# Patient Record
Sex: Female | Born: 1980 | Hispanic: Yes | Marital: Single | Smoking: Never smoker
Health system: Southern US, Community
[De-identification: ages and names within clinical notes are randomized; demographics above are authoritative.]

## PROBLEM LIST (undated history)

## (undated) ENCOUNTER — Inpatient Hospital Stay (HOSPITAL_COMMUNITY): Payer: Medicaid Other

## (undated) DIAGNOSIS — Z789 Other specified health status: Secondary | ICD-10-CM

## (undated) HISTORY — PX: NO PAST SURGERIES: SHX2092

## (undated) HISTORY — DX: Other specified health status: Z78.9

## (undated) HISTORY — PX: OTHER SURGICAL HISTORY: SHX169

---

## 2021-03-04 ENCOUNTER — Other Ambulatory Visit: Payer: Self-pay

## 2021-03-04 ENCOUNTER — Ambulatory Visit (LOCAL_COMMUNITY_HEALTH_CENTER): Payer: Self-pay

## 2021-03-04 VITALS — BP 146/86 | Wt 230.0 lb

## 2021-03-04 DIAGNOSIS — Z3201 Encounter for pregnancy test, result positive: Secondary | ICD-10-CM

## 2021-03-04 LAB — PREGNANCY, URINE: Preg Test, Ur: POSITIVE — AB

## 2021-03-04 MED ORDER — PRENATAL 27-0.8 MG PO TABS: 1.0000 | ORAL_TABLET | Freq: Every day | ORAL | 0 refills | Status: AC

## 2021-03-04 NOTE — Progress Notes (Signed)
UPT positive. Plans prenatal care at [facility]. BP sl elevated today and pt reports no prior hx HBP and states she get very nervous when BP taken. To clerk for preadmit. No NCIR on file. M. Yemen, interpreter. Jerel Shepherd, RN

## 2021-03-15 ENCOUNTER — Telehealth: Payer: Self-pay | Admitting: Family Medicine

## 2021-03-15 NOTE — Telephone Encounter (Signed)
Call to client with Samantha Dominguez. Client reports 5 day history of severe burning stomach pain that is preventing her from eating and drinking. States she feels weak and shaky. As not yet established care in [facility], client counseled to seek medical care today for evaluation. As new to the state, counseled regarding [facility] ED and local walk-in clinics. ESherren Kerns CNM in agreement with above plan. Jossie Ng, RN

## 2021-03-15 NOTE — Telephone Encounter (Signed)
Pt. Wants to be seeing because she is not feeling well, her new OB appointment is on 8/12. If someone can please give her a call back. Thanks (needs interpreter)

## 2021-04-08 DIAGNOSIS — R519 Headache, unspecified: Secondary | ICD-10-CM

## 2021-04-08 DIAGNOSIS — K219 Gastro-esophageal reflux disease without esophagitis: Secondary | ICD-10-CM

## 2021-04-08 HISTORY — DX: Gastro-esophageal reflux disease without esophagitis: K21.9

## 2021-04-08 HISTORY — DX: Headache, unspecified: R51.9

## 2021-04-08 NOTE — Progress Notes (Signed)
OB abstraction completed per phone interview with patient (assisted by language line interpreter Thomasenia Sales id # [phone]) Pt aware of appointment tomorrow. Henriette Combs RN

## 2021-04-09 ENCOUNTER — Ambulatory Visit: Payer: Medicaid Other | Admitting: Advanced Practice Midwife

## 2021-04-09 ENCOUNTER — Encounter: Payer: Self-pay | Admitting: Advanced Practice Midwife

## 2021-04-09 ENCOUNTER — Other Ambulatory Visit: Payer: Self-pay

## 2021-04-09 VITALS — BP 139/78 | HR 97 | Temp 97.1°F | Ht 64.0 in | Wt 230.0 lb

## 2021-04-09 DIAGNOSIS — K117 Disturbances of salivary secretion: Secondary | ICD-10-CM | POA: Insufficient documentation

## 2021-04-09 DIAGNOSIS — O0991 Supervision of high risk pregnancy, unspecified, first trimester: Secondary | ICD-10-CM

## 2021-04-09 DIAGNOSIS — O161 Unspecified maternal hypertension, first trimester: Secondary | ICD-10-CM

## 2021-04-09 DIAGNOSIS — O99211 Obesity complicating pregnancy, first trimester: Secondary | ICD-10-CM

## 2021-04-09 DIAGNOSIS — Z550 Illiteracy and low-level literacy: Secondary | ICD-10-CM

## 2021-04-09 DIAGNOSIS — O10919 Unspecified pre-existing hypertension complicating pregnancy, unspecified trimester: Secondary | ICD-10-CM | POA: Insufficient documentation

## 2021-04-09 DIAGNOSIS — O09529 Supervision of elderly multigravida, unspecified trimester: Secondary | ICD-10-CM

## 2021-04-09 DIAGNOSIS — O0990 Supervision of high risk pregnancy, unspecified, unspecified trimester: Secondary | ICD-10-CM | POA: Insufficient documentation

## 2021-04-09 DIAGNOSIS — O99210 Obesity complicating pregnancy, unspecified trimester: Secondary | ICD-10-CM | POA: Insufficient documentation

## 2021-04-09 DIAGNOSIS — K219 Gastro-esophageal reflux disease without esophagitis: Secondary | ICD-10-CM | POA: Insufficient documentation

## 2021-04-09 LAB — URINALYSIS
Bilirubin, UA: NEGATIVE
Glucose, UA: NEGATIVE
Ketones, UA: NEGATIVE
Leukocytes,UA: NEGATIVE
Nitrite, UA: NEGATIVE
Protein,UA: NEGATIVE
Specific Gravity, UA: 1.01 (ref 1.005–1.030)
Urobilinogen, Ur: 0.2 mg/dL (ref 0.2–1.0)
pH, UA: 6.5 (ref 5.0–7.5)

## 2021-04-09 LAB — HEMOGLOBIN, FINGERSTICK: Hemoglobin: 12.3 g/dL (ref 11.1–15.9)

## 2021-04-09 LAB — WET PREP FOR TRICH, YEAST, CLUE
Trichomonas Exam: NEGATIVE
Yeast Exam: NEGATIVE

## 2021-04-09 NOTE — Progress Notes (Signed)
[facility] HEALTH DEPT [facility] [address] [place] [address] [phone]  INITIAL PRENATAL VISIT NOTE  Subjective:  Samantha Dominguez is a 40 y.o. SHF G4P3003 (18, 9,2) at [redacted]w[redacted]d being seen today to start prenatal care at the [facility].  She feels "good" about surprise pregnancy with no birth control. 40 yo employed FOB feels "fine" about pregnancy; he is the father of her 2 yo also; in supportive 4 year relationship.  She finished first grade and cannot read or write; not working; living with her 2 youngest children (72 yo son married and living in [place]); FOB lives in another residence and pays for her expenses. She denies ER use or u/s this pregnancy.LMP 01/11/21.  Moved to [place] at age 40 and has been back and forth to [place] all her life. Moved to [facility] from [place] in 09/2020. Last dental exam 2 years ago. Wants BTL. She is currently monitored for the following issues for this high-risk pregnancy and has Obesity affecting pregnancy BMI=39.4; Advanced maternal age in multigravida 40 yo; Elevated blood pressure affecting pregnancy in first trimester 04/09/21=139/78 & 146/81; Supervision of high risk pregnancy in first trimester; and GERD (gastroesophageal reflux disease) on their problem list.  Patient reports no complaints.   .  .   . Denies leaking of fluid.   Indications for ASA therapy (per uptodate) One of the following: Previous pregnancy with preeclampsia, especially early onset and with an adverse outcome No Multifetal gestation No Chronic hypertension No Type 1 or 2 diabetes mellitus No Chronic kidney disease No Autoimmune disease (antiphospholipid syndrome, systemic lupus erythematosus) No  Two or more of the following: Nulliparity No Obesity (body mass index >30 kg/m2) Yes Family history of preeclampsia in mother or sister No Age ?35 years Yes Sociodemographic characteristics (African American  race, low socioeconomic level) No Personal risk factors (eg, previous pregnancy with low birth weight or small for gestational age infant, previous adverse pregnancy outcome [eg, stillbirth], interval >10 years between pregnancies) No   The following portions of the patient's history were reviewed and updated as appropriate: allergies, current medications, past family history, past medical history, past social history, past surgical history and problem list. Problem list updated.  Objective:   Vitals:   04/09/21 0917 04/09/21 0924  BP: 139/78   Pulse: 97   Temp: (!) 97.1 F (36.2 C)   Weight: 230 lb (104.3 kg)   Height:  5\' 4"  (1.626 m)    Fetal Status:            Physical Exam Vitals and nursing note reviewed.  Constitutional:      General: She is not in acute distress.    Appearance: Normal appearance. She is well-developed. She is obese.  HENT:     Head: Normocephalic and atraumatic.     Right Ear: External ear normal.     Left Ear: External ear normal.     Nose: Nose normal. No congestion or rhinorrhea.     Mouth/Throat:     Lips: Pink.     Mouth: Mucous membranes are moist.     Dentition: Normal dentition. No dental caries.     Pharynx: Oropharynx is clear. Uvula midline.     Comments: Dentition: last dental exam  2 years ago; encouraged exam asap Eyes:     General: No scleral icterus.    Conjunctiva/sclera: Conjunctivae normal.  Neck:     Thyroid: No thyroid mass or thyroid tenderness.  Cardiovascular:     Rate and Rhythm: Normal rate.     Pulses: Normal pulses.     Comments: Extremities are warm and well perfused Pulmonary:     Effort: Pulmonary effort is normal.     Breath sounds: Normal breath sounds.  Chest:     Chest wall: No mass.  Breasts:    Tanner Score is 5.     Breasts are symmetrical.     Right: Normal. No mass, nipple discharge or skin change.     Left: Normal. No mass, nipple discharge or skin change.  Abdominal:     Palpations: Abdomen is  soft.     Tenderness: There is no abdominal tenderness.     Comments: Gravid, poor tone, soft without masses or tenderness, no FHR heard, unable to assess uterine size due to increased adipose  Genitourinary:    General: Normal vulva.     Exam position: Lithotomy position.     Pubic Area: No rash.      Labia:        Right: No rash.        Left: No rash.      Vagina: Vaginal discharge (white creamy leukorrhea, ph<4.5) present.     Cervix: Normal.     Uterus: Enlarged (Gravid ? size; unable to assess due to increased adippose). Not tender.      Rectum: Normal. No external hemorrhoid.     Comments: Pap done Musculoskeletal:     Right lower leg: No edema.     Left lower leg: No edema.  Lymphadenopathy:     Cervical: No cervical adenopathy.     Upper Body:     Right upper body: No axillary adenopathy.     Left upper body: No axillary adenopathy.  Skin:    General: Skin is warm.     Capillary Refill: Capillary refill takes less than 2 seconds.  Neurological:     Mental Status: She is alert.    Assessment and Plan:  Pregnancy: G4P3003 at [redacted]w[redacted]d  1. Obesity affecting pregnancy, antepartum Accepts ASA 81 mg daily and agrees to buy today - Glucose, 1 hour gestational - Comprehensive metabolic panel - Protein / creatinine ratio, urine  (Spot) - TSH  2. Antepartum multigravida of advanced maternal age  25. Elevated blood pressure affecting pregnancy in first trimester 04/09/21= 139/78 146/81 Pt denies any blood pressure issues with any of her prior 3 pregnancies.  PIH panel drawn Monitor BP closely  4. Supervision of high risk pregnancy in first trimester Desires FIRST screen--ordered Early glucola done today Counseled on wt gain of 11-20 lbs this pregnancy Please give dental list to pt Pt desires BTL; please give pt list of cost for BTL with [facility] - HIV [place] [facility] - Prenatal Profile with Varicella(282020) - Varicella zoster antibody, IgG - HCV Ab w Reflex to Quant PCR -  Urine Culture - Chlamydia/GC NAA, Confirmation - Hemoglobinopathy evaluation -967893 - QuantiFERON-TB Gold Plus - Hemoglobin, fingerstick - Urinalysis (Urine Dip) - 810175 Drug Screen - TSH + free T4 - Hgb A1c w/o eAG - WET PREP FOR TRICH, YEAST, CLUE - IGP, Aptima HPV - PIH Panel ([facility] [id number])    Discussed overview of care and coordination with inpatient delivery practices including [facility], [facility], [facility] and [facility].   Reviewed Centering pregnancy as standard of care at [facility]   Preterm labor symptoms and general obstetric precautions including but not limited to vaginal bleeding, contractions, leaking of fluid and fetal movement  were reviewed in detail with the patient.  Please refer to After Visit Summary for other counseling recommendations.   Return in about 4 weeks (around 05/07/2021) for routine PNC.  No future appointments.  Alberteen Spindle, CNM

## 2021-04-09 NOTE — Progress Notes (Signed)
Urine dip, wet prep, hgb reviewed. WNL. NO treatment indicated.   Lead questionnaire = negative.   [place] referral received today from provider. Faxed to [facility] with confirmation on 04/09/21. Patient aware she will receive a call to schedule upcoming [place].   Floy Sabina, RN

## 2021-04-10 LAB — PROTEIN / CREATININE RATIO, URINE
Creatinine, Urine: 40 mg/dL
Protein, Ur: 9.6 mg/dL
Protein/Creat Ratio: 240 mg/g creat — ABNORMAL HIGH (ref 0–200)

## 2021-04-10 LAB — 789231 7+OXYCODONE-BUND
Amphetamines, Urine: NEGATIVE ng/mL
BENZODIAZ UR QL: NEGATIVE ng/mL
Barbiturate screen, urine: NEGATIVE ng/mL
Cannabinoid Quant, Ur: NEGATIVE ng/mL
Cocaine (Metab.): NEGATIVE ng/mL
OPIATE SCREEN URINE: NEGATIVE ng/mL
Oxycodone/Oxymorphone, Urine: NEGATIVE ng/mL
PCP Quant, Ur: NEGATIVE ng/mL

## 2021-04-11 LAB — URINE CULTURE

## 2021-04-13 LAB — CHLAMYDIA/GC NAA, CONFIRMATION
Chlamydia trachomatis, NAA: NEGATIVE
Neisseria gonorrhoeae, NAA: NEGATIVE

## 2021-04-14 ENCOUNTER — Telehealth: Payer: Self-pay

## 2021-04-14 NOTE — Telephone Encounter (Signed)
Call received from Clydie Braun MFM scheduler, in response to faxed referral for first screen with no genetic counseling. Per Britta Mccreedy, no available appt in needed time frame for first screen. Call to MFM in [place] and per [place], they do not have any appts in required time frame. As client does not desire genetic counseling, referral returned to E. Sciora CNM with above noted on referral. Jossie Ng, RN

## 2021-04-15 ENCOUNTER — Telehealth: Payer: Self-pay

## 2021-04-15 ENCOUNTER — Telehealth: Payer: Self-pay | Admitting: Obstetrics and Gynecology

## 2021-04-15 LAB — IGP, APTIMA HPV
HPV Aptima: NEGATIVE
PAP Smear Comment: 0

## 2021-04-15 NOTE — Telephone Encounter (Signed)
PC to patient with assistance of [employer]pre[employer] to clarify what appointments the patient desires. She stated that she is not interested in screening for chromosome conditions unless it is absolutely necessary.  I explained that it was her choice if she wanted that information. She stated that she has faith that the baby will be healthy and does not desire any testing other than ultrasound.  Because no fetal heart tones were heard at her last OB visit, we scheduled her for a first trimester viability u/s in [facility] tomorrow 8/19 at 10:30.  She will also be scheduled for an ultrasound in [facility] at [facility] in the second trimester.    Cherly Anderson, MS, CGC

## 2021-04-15 NOTE — Telephone Encounter (Signed)
Contacted patient regarding upcoming ultrasound appointments:  [place] viability u/s scheduled 04/16/21 at 10:30 [place] [facility] for anatomy 18-19wks on 9/27 at 0945  Patient aware and all questions answered.   Floy Sabina, RN

## 2021-04-15 NOTE — Addendum Note (Signed)
Addended by: Floy Sabina on: 04/15/2021 03:14 PM   Modules accepted: Orders

## 2021-04-16 ENCOUNTER — Encounter: Payer: Self-pay | Admitting: *Deleted

## 2021-04-16 ENCOUNTER — Other Ambulatory Visit: Payer: Self-pay

## 2021-04-16 ENCOUNTER — Ambulatory Visit: Payer: Medicaid Other | Admitting: *Deleted

## 2021-04-16 ENCOUNTER — Telehealth: Payer: Self-pay

## 2021-04-16 ENCOUNTER — Ambulatory Visit: Payer: Medicaid Other | Attending: Advanced Practice Midwife

## 2021-04-16 VITALS — BP 135/79 | HR 79

## 2021-04-16 DIAGNOSIS — O0991 Supervision of high risk pregnancy, unspecified, first trimester: Secondary | ICD-10-CM | POA: Insufficient documentation

## 2021-04-16 DIAGNOSIS — O09521 Supervision of elderly multigravida, first trimester: Secondary | ICD-10-CM

## 2021-04-16 DIAGNOSIS — Z6791 Unspecified blood type, Rh negative: Secondary | ICD-10-CM | POA: Insufficient documentation

## 2021-04-16 DIAGNOSIS — Z2839 Other underimmunization status: Secondary | ICD-10-CM | POA: Insufficient documentation

## 2021-04-16 DIAGNOSIS — O26899 Other specified pregnancy related conditions, unspecified trimester: Secondary | ICD-10-CM | POA: Insufficient documentation

## 2021-04-16 DIAGNOSIS — O09899 Supervision of other high risk pregnancies, unspecified trimester: Secondary | ICD-10-CM | POA: Insufficient documentation

## 2021-04-16 DIAGNOSIS — O99810 Abnormal glucose complicating pregnancy: Secondary | ICD-10-CM | POA: Insufficient documentation

## 2021-04-16 LAB — COMPREHENSIVE METABOLIC PANEL
ALT: 23 IU/L (ref 0–32)
Albumin/Globulin Ratio: 1.5 (ref 1.2–2.2)
Albumin: 4.4 g/dL (ref 3.8–4.8)
Alkaline Phosphatase: 89 IU/L (ref 44–121)
BUN/Creatinine Ratio: 7 — ABNORMAL LOW (ref 9–23)
Bilirubin Total: 0.4 mg/dL (ref 0.0–1.2)
CO2: 22 mmol/L (ref 20–29)
Calcium: 9.3 mg/dL (ref 8.7–10.2)
Chloride: 99 mmol/L (ref 96–106)
Globulin, Total: 3 g/dL (ref 1.5–4.5)
Glucose: 154 mg/dL — ABNORMAL HIGH (ref 65–99)
Potassium: 3.6 mmol/L (ref 3.5–5.2)
Sodium: 136 mmol/L (ref 134–144)
Total Protein: 7.4 g/dL (ref 6.0–8.5)

## 2021-04-16 LAB — CBC/D/PLT+RPR+RH+ABO+RUB AB...
Antibody Screen: NEGATIVE
Basophils Absolute: 0 10*3/uL (ref 0.0–0.2)
Basos: 1 %
EOS (ABSOLUTE): 0 10*3/uL (ref 0.0–0.4)
Eos: 0 %
Hematocrit: 38.3 % (ref 34.0–46.6)
Hemoglobin: 12.2 g/dL (ref 11.1–15.9)
Hepatitis B Surface Ag: NEGATIVE
Immature Grans (Abs): 0 10*3/uL (ref 0.0–0.1)
Immature Granulocytes: 0 %
Lymphocytes Absolute: 1.3 10*3/uL (ref 0.7–3.1)
Lymphs: 18 %
MCH: 24.4 pg — ABNORMAL LOW (ref 26.6–33.0)
MCHC: 31.9 g/dL (ref 31.5–35.7)
MCV: 77 fL — ABNORMAL LOW (ref 79–97)
Monocytes Absolute: 0.3 10*3/uL (ref 0.1–0.9)
Monocytes: 4 %
Neutrophils Absolute: 5.8 10*3/uL (ref 1.4–7.0)
Neutrophils: 77 %
Platelets: 319 10*3/uL (ref 150–450)
RBC: 5 x10E6/uL (ref 3.77–5.28)
RDW: 17.9 % — ABNORMAL HIGH (ref 11.7–15.4)
RPR Ser Ql: NONREACTIVE
Rh Factor: NEGATIVE
Rubella Antibodies, IGG: 19.5 index (ref >0.99)
Varicella zoster IgG: <135 index — ABNORMAL LOW (ref >165)
WBC: 7.5 10*3/uL (ref 3.4–10.8)

## 2021-04-16 LAB — HCV AB W REFLEX TO QUANT PCR: HCV Ab: 0.1 s/co ratio (ref 0.0–0.9)

## 2021-04-16 LAB — HGB FRACTIONATION CASCADE
Hgb A2: 2.4 % (ref 1.8–3.2)
Hgb A: 97.6 % (ref 96.4–98.8)
Hgb F: 0 % (ref 0.0–2.0)
Hgb S: 0 %

## 2021-04-16 LAB — QUANTIFERON-TB GOLD PLUS
QuantiFERON Mitogen Value: 6.1 IU/mL
QuantiFERON Nil Value: 0.08 IU/mL
QuantiFERON TB1 Ag Value: 0.05 IU/mL
QuantiFERON TB2 Ag Value: 0.11 IU/mL
QuantiFERON-TB Gold Plus: NEGATIVE

## 2021-04-16 LAB — AST+BUN+CREAT+LD+URIC A+HGB...
AST: 15 IU/L (ref 0–40)
BUN: 4 mg/dL — ABNORMAL LOW (ref 6–20)
Creatinine, Ser: 0.57 mg/dL (ref 0.57–1.00)
LDH: 141 IU/L (ref 119–226)
Uric Acid: 3.9 mg/dL (ref 2.6–6.2)
eGFR: 118 mL/min/{1.73_m2} (ref >59)

## 2021-04-16 LAB — HIV-1/HIV-2 QUALITATIVE RNA
HIV-1 RNA, Qualitative: NONREACTIVE
HIV-2 RNA, Qualitative: NONREACTIVE

## 2021-04-16 LAB — TSH+FREE T4
Free T4: 1.27 ng/dL (ref 0.82–1.77)
TSH: 2.55 u[IU]/mL (ref 0.450–4.500)

## 2021-04-16 LAB — HGB A1C W/O EAG: Hgb A1c MFr Bld: 5.4 % (ref 4.8–5.6)

## 2021-04-16 LAB — GLUCOSE, 1 HOUR GESTATIONAL: Gestational Diabetes Screen: 144 mg/dL — ABNORMAL HIGH (ref 65–139)

## 2021-04-16 LAB — HCV INTERPRETATION

## 2021-04-16 NOTE — Telephone Encounter (Signed)
Attempt to contact patient regarding elevated 1 hr glucose. Want to scheduled visit for nurse clinic/lab for 3 hr gtt as soon as possible.   No answer and LVM for patient to return call at [phone].   Floy Sabina, RN

## 2021-04-16 NOTE — Telephone Encounter (Signed)
Patient returned phone call and made an appointment for 3 hr gtt on 04/20/21. Patient advised to report to lab after check-in and to come in fasting for 12 hours prior to appointment. Patient reports understanding and all questions answered.   Floy Sabina, RN

## 2021-04-16 NOTE — Progress Notes (Signed)
BP rechecked per Dr. Tedra Senegal

## 2021-04-19 NOTE — Addendum Note (Signed)
Addended by: Heywood Bene on: 04/19/2021 12:25 PM   Modules accepted: Orders

## 2021-04-20 ENCOUNTER — Other Ambulatory Visit: Payer: Medicaid Other

## 2021-04-20 ENCOUNTER — Other Ambulatory Visit: Payer: Self-pay

## 2021-04-20 ENCOUNTER — Encounter: Payer: Self-pay | Admitting: Advanced Practice Midwife

## 2021-04-20 DIAGNOSIS — Z719 Counseling, unspecified: Secondary | ICD-10-CM

## 2021-04-20 NOTE — Progress Notes (Signed)
Received call from [facility] lab that client ate a chicken sandwich last night at 2300. Client to [facility] and counseled regarding why test unable to be performed today, importance of 3 hour GTT and test prep instructions. Client verbalized understanding. Due to transportation issues, client requested appt be scheduled on 04/26/2021. Appt scheduled and client to come to clinic after drops daughter off at school on Monday. Appt reminder card given. Roddie Mc Yemen interpreted during above conversation. Jossie Ng, RN

## 2021-04-23 ENCOUNTER — Telehealth: Payer: Self-pay

## 2021-04-23 NOTE — Telephone Encounter (Signed)
Patient called she need to reschedule her 3 hr gtt for Monday 9/29 as she has transportation issues. Patient can only come in Friday morning so I scheduled patient to come in Friday September 2 at 0800. Emphasized to patient not food in the mouth 12 hours before appointment, so I told her so eating after 8pm the night before or no drinks as well. Patient verbalized understanding.   Floy Sabina, RN

## 2021-04-26 ENCOUNTER — Other Ambulatory Visit: Payer: Medicaid Other

## 2021-04-29 ENCOUNTER — Encounter: Payer: Self-pay | Admitting: Advanced Practice Midwife

## 2021-04-30 ENCOUNTER — Other Ambulatory Visit: Payer: Medicaid Other

## 2021-04-30 ENCOUNTER — Other Ambulatory Visit: Payer: Self-pay

## 2021-04-30 VITALS — Wt 228.3 lb

## 2021-04-30 DIAGNOSIS — O99810 Abnormal glucose complicating pregnancy: Secondary | ICD-10-CM

## 2021-04-30 NOTE — Progress Notes (Signed)
Patient seen in Nurse Clinic for a 3 HR GTT lab test.  She verbalizes being NPO since last night at 7 pm.  She verbalizes understanding to remain NPO, no chewing gum and not to be moving around burning calories until her last lab draw at 11:30 am.  Advised to notify staff if she were to start with nausea and/or vomiting. Hart Carwin, RN

## 2021-05-01 LAB — GLUCOSE TOLERANCE TEST, 6 HOUR
Glucose, 1 Hour GTT: 179 mg/dL (ref 65–199)
Glucose, 2 hour: 155 mg/dL — ABNORMAL HIGH (ref 65–139)
Glucose, 3 hour: 94 mg/dL (ref 65–109)
Glucose, GTT - Fasting: 94 mg/dL (ref 65–99)

## 2021-05-04 ENCOUNTER — Other Ambulatory Visit: Payer: Self-pay | Admitting: Family Medicine

## 2021-05-04 DIAGNOSIS — O99810 Abnormal glucose complicating pregnancy: Secondary | ICD-10-CM

## 2021-05-04 NOTE — Progress Notes (Signed)
Abnormal 3 hour gtt with 2hr = 155

## 2021-05-07 ENCOUNTER — Other Ambulatory Visit: Payer: Self-pay

## 2021-05-07 ENCOUNTER — Encounter: Payer: Self-pay | Admitting: Dietician

## 2021-05-07 ENCOUNTER — Ambulatory Visit: Payer: Medicaid Other | Admitting: Advanced Practice Midwife

## 2021-05-07 ENCOUNTER — Ambulatory Visit: Payer: Medicaid Other | Admitting: Dietician

## 2021-05-07 VITALS — BP 147/84 | HR 102 | Temp 97.5°F | Wt 228.8 lb

## 2021-05-07 DIAGNOSIS — O99810 Abnormal glucose complicating pregnancy: Secondary | ICD-10-CM

## 2021-05-07 DIAGNOSIS — O99212 Obesity complicating pregnancy, second trimester: Secondary | ICD-10-CM

## 2021-05-07 DIAGNOSIS — O0992 Supervision of high risk pregnancy, unspecified, second trimester: Secondary | ICD-10-CM

## 2021-05-07 DIAGNOSIS — O161 Unspecified maternal hypertension, first trimester: Secondary | ICD-10-CM

## 2021-05-07 DIAGNOSIS — O99210 Obesity complicating pregnancy, unspecified trimester: Secondary | ICD-10-CM

## 2021-05-07 DIAGNOSIS — O26892 Other specified pregnancy related conditions, second trimester: Secondary | ICD-10-CM

## 2021-05-07 DIAGNOSIS — Z6791 Unspecified blood type, Rh negative: Secondary | ICD-10-CM

## 2021-05-07 DIAGNOSIS — O09899 Supervision of other high risk pregnancies, unspecified trimester: Secondary | ICD-10-CM

## 2021-05-07 DIAGNOSIS — O0991 Supervision of high risk pregnancy, unspecified, first trimester: Secondary | ICD-10-CM

## 2021-05-07 DIAGNOSIS — Z2839 Other underimmunization status: Secondary | ICD-10-CM

## 2021-05-07 DIAGNOSIS — O09522 Supervision of elderly multigravida, second trimester: Secondary | ICD-10-CM

## 2021-05-07 DIAGNOSIS — O26891 Other specified pregnancy related conditions, first trimester: Secondary | ICD-10-CM

## 2021-05-07 DIAGNOSIS — O09529 Supervision of elderly multigravida, unspecified trimester: Secondary | ICD-10-CM

## 2021-05-07 NOTE — Progress Notes (Signed)
Assessment: Ht: 65 in Wt: 228 lbs BMI:  Labs: na Meds: PNV Dx: Abnormal glucose in pregnancy Living Conditions:  Adequate household items to prepare/store food Language:  Spanish Time:   1:00 PM   Duration:  20 minutes   Spoke to client today regarding abnormal glucose in pregnancy. She had previously had severe nausea/vomiting which she reports is improving. She provided a 24 hour recall. I explained the abnormal glucose and provided recommendations. She was compliant and receptive.   24 Hour Recall/Diet Hx Breakfast:  2 eggs with toast and juice Lunch:  soup with carrots, potatoes and pasta with water Dinner:  soup again with juice Snack:  oatmeal with water  Diagnosis: Abnormal glucose related to pregnancy as evidenced by a glucose level of 155. Intervention:  1) 30-45 g of C for meals, 15 g for snacks 2) 5-6 small meals per day 3) protein with meals 4) water instead of juice 5) exercise daily with provider's permission  Monitoring/Evaluation:  Will follow up at [facility] appointments  Marcha Dutton RDN, LDN 05/07/21

## 2021-05-07 NOTE — Progress Notes (Signed)
PRENATAL VISIT NOTE  Subjective:  Samantha Dominguez is a 40 y.o. G4P3003 at [redacted]w[redacted]d being seen today for ongoing prenatal care.  She is currently monitored for the following issues for this high-risk pregnancy and has Obesity affecting pregnancy BMI=39.4; Advanced maternal age in multigravida 40 yo; Elevated blood pressure affecting pregnancy in first trimester 04/09/21=139/78 & 146/81; Supervision of high risk pregnancy in first trimester; GERD (gastroesophageal reflux disease); Low-level of literacy (completed first grade); Ptyalism; Rh negative status during pregnancy; Maternal varicella, non-immune; and Abnormal glucose affecting pregnancy 04/09/21: 3 hour GTT=equivocal on their problem list.  Patient reports vomiting.  Contractions: Not present. Vag. Bleeding: None.  Movement: Absent. Denies leaking of fluid/ROM.   The following portions of the patient's history were reviewed and updated as appropriate: allergies, current medications, past family history, past medical history, past social history, past surgical history and problem list. Problem list updated.  Objective:   Vitals:   05/07/21 1559  BP: (!) 147/84  Pulse: (!) 102  Temp: (!) 97.5 F (36.4 C)  Weight: 228 lb 12.8 oz (103.8 kg)    Fetal Status: Fetal Heart Rate (bpm): 160 Fundal Height: 15 cm Movement: Absent     General:  Alert, oriented and cooperative. Patient is in no acute distress.  Skin: Skin is warm and dry. No rash noted.   Cardiovascular: Normal heart rate noted  Respiratory: Normal respiratory effort, no problems with respiration noted  Abdomen: Soft, gravid, appropriate for gestational age.  Pain/Pressure: Absent     Pelvic: Cervical exam deferred        Extremities: Normal range of motion.  Edema: None  Mental Status: Normal mood and affect. Normal behavior. Normal judgment and thought content.   Assessment and Plan:  Pregnancy: G4P3003 at [redacted]w[redacted]d  1. Rh negative status during pregnancy in first  trimester 2. Maternal varicella, non-immune  3. Abnormal glucose affecting pregnancy Equivocal 3 hour GTT on 04/30/21 with ADA diet counseling done 05/07/21 Needs 3 hour GTT at 28 wks  4. Supervision of high risk pregnancy in first trimester C/o N&V 2-3x/day but states is getting better. Yesterday vomited 3x. Today vomited 3x. Day before vomited 2x. Eating protein foods--suggestions given. Accepts Phenergan rx Declined quad screen and genetic testing 1 hour glucola on 04/09/21=144 Not working; here with 2 daughters - Urinalysis (Urine Dip)  5. Antepartum multigravida of advanced maternal age Pt reminded of anatomy u/s 05/25/21 with MFM Will need serial growth u/s q 4 wks due to 40 yo at birth  59. Obesity affecting pregnancy, antepartum 38 lb 12.8 oz (17.6 kg) Lost 2 lbs in last 3 wks Taking ASA 81 mg daily  7. Elevated blood pressure affecting pregnancy in first trimester 04/09/21=139/78 & 146/81 BP continues to be elevated: 04/09/21=146/78, 139/78                                                 04/16/21=164/86, 135/79                                                 05/07/21=147/84, 145/86 Denies h/a, scotoma, epigastric pain. Denies any HTN issues in past or with pregnancies. Lab refuses to check urine or do bloodwork today because it is  5:01 pm.  RTC 05/10/21 for urine dip, spot protein/creat ratio, TSH Spot protein/creat ratio on 04/09/21=240 TSH on 04/09/21=2.5 wnl Suspect chronic HTN   Preterm labor symptoms and general obstetric precautions including but not limited to vaginal bleeding, contractions, leaking of fluid and fetal movement were reviewed in detail with the patient. Please refer to After Visit Summary for other counseling recommendations.  No follow-ups on file.  Future Appointments  Date Time Provider Department Center  05/25/2021 10:00 AM [facility]    Alberteen Spindle, CNM

## 2021-05-07 NOTE — Progress Notes (Addendum)
Patient here for MH RV at 15 2/7. Quad declination signed. Aware of U/S on 05/25/2021 at 0945, and aware she may not bring children with her. Patient talked with nutritionist and states she is so nauseas she can't eat much, states she has bitter taste in her mouth and vomits 3x/day. States she can eat eggs and fruit, but no meat..Counseled Rh negative status and varicella non-immune status, literature given. Counseling done by E. Leticia Clas, RN.  ..Burt Knack, RN

## 2021-05-10 ENCOUNTER — Ambulatory Visit: Payer: Medicaid Other | Admitting: Advanced Practice Midwife

## 2021-05-10 ENCOUNTER — Other Ambulatory Visit: Payer: Self-pay

## 2021-05-10 VITALS — BP 138/81 | HR 98 | Temp 97.5°F | Wt 227.8 lb

## 2021-05-10 DIAGNOSIS — O09522 Supervision of elderly multigravida, second trimester: Secondary | ICD-10-CM

## 2021-05-10 DIAGNOSIS — O99210 Obesity complicating pregnancy, unspecified trimester: Secondary | ICD-10-CM

## 2021-05-10 DIAGNOSIS — O99212 Obesity complicating pregnancy, second trimester: Secondary | ICD-10-CM

## 2021-05-10 DIAGNOSIS — O09529 Supervision of elderly multigravida, unspecified trimester: Secondary | ICD-10-CM

## 2021-05-10 DIAGNOSIS — O162 Unspecified maternal hypertension, second trimester: Secondary | ICD-10-CM

## 2021-05-10 DIAGNOSIS — O0992 Supervision of high risk pregnancy, unspecified, second trimester: Secondary | ICD-10-CM

## 2021-05-10 DIAGNOSIS — O0991 Supervision of high risk pregnancy, unspecified, first trimester: Secondary | ICD-10-CM

## 2021-05-10 DIAGNOSIS — O161 Unspecified maternal hypertension, first trimester: Secondary | ICD-10-CM

## 2021-05-10 LAB — URINALYSIS
Bilirubin, UA: NEGATIVE
Glucose, UA: NEGATIVE
Nitrite, UA: NEGATIVE
Protein,UA: NEGATIVE
Specific Gravity, UA: 1.015 (ref 1.005–1.030)
Urobilinogen, Ur: 0.2 mg/dL (ref 0.2–1.0)
pH, UA: 6 (ref 5.0–7.5)

## 2021-05-10 NOTE — Addendum Note (Signed)
Addended by: Heywood Bene on: 05/10/2021 02:18 PM   Modules accepted: Orders

## 2021-05-10 NOTE — Progress Notes (Signed)
Patient here for OB problem visit, for BP check and urine dip.Burt Knack, RN

## 2021-05-10 NOTE — Progress Notes (Signed)
   PRENATAL VISIT NOTE  Subjective:  Samantha Dominguez is a 40 y.o. G4P3003 at [redacted]w[redacted]d being seen today for ongoing prenatal care.  She is currently monitored for the following issues for this high-risk pregnancy and has Obesity affecting pregnancy BMI=39.4; Advanced maternal age in multigravida 40 yo; Elevated blood pressure affecting pregnancy in first trimester 04/09/21=139/78 & 146/81; Supervision of high risk pregnancy in first trimester; GERD (gastroesophageal reflux disease); Low-level of literacy (completed first grade); Ptyalism; Rh negative status during pregnancy; Maternal varicella, non-immune; and Abnormal glucose affecting pregnancy 04/09/21: 3 hour GTT=equivocal on their problem list.  Patient reports vomiting.  Contractions: Not present. Vag. Bleeding: None.  Movement: Absent. Denies leaking of fluid/ROM.   The following portions of the patient's history were reviewed and updated as appropriate: allergies, current medications, past family history, past medical history, past social history, past surgical history and problem list. Problem list updated.  Objective:   Vitals:   05/10/21 0830  BP: 138/81  Pulse: 98  Temp: (!) 97.5 F (36.4 C)  Weight: 227 lb 12.8 oz (103.3 kg)    Fetal Status: Fetal Heart Rate (bpm): 160 Fundal Height: 15 cm Movement: Absent     General:  Alert, oriented and cooperative. Patient is in no acute distress.  Skin: Skin is warm and dry. No rash noted.   Cardiovascular: Normal heart rate noted  Respiratory: Normal respiratory effort, no problems with respiration noted  Abdomen: Soft, gravid, appropriate for gestational age.  Pain/Pressure: Absent     Pelvic: Cervical exam deferred        Extremities: Normal range of motion.  Edema: None  Mental Status: Normal mood and affect. Normal behavior. Normal judgment and thought content.   Assessment and Plan:  Pregnancy: G4P3003 at [redacted]w[redacted]d  1. Supervision of high risk pregnancy in first trimester States  she has taken Phenergan only once since given on 05/07/21 (took at 9 pm 05/07/21) Continues to vomit q am  Vomited at 0600 this am, yesterday x1 in am only, day before x1 in am only Breakfast this am: 2 bananas, 1 glass of milk Dinner last night: rice, potatoes, brocolli, water Snack before bed: oatmeal TSH today Reviewed 04/16/21 u/s at 12 2/7 wks with EDC change of 10/27/21; reminded of anatomy u/s 05/25/21 Wants Nexplanon pp   - Urinalysis - TSH - Protein / creatinine ratio, urine  (Spot) - Urine Culture & Sensitivity  2. Obesity affecting pregnancy, antepartum 37 lb 12.8 oz (17.1 kg) 1 lb wt loss since 05/07/21 (227 lbs today) Taking ASA 81 mg daily  3. Elevated blood pressure affecting pregnancy in first trimester 04/09/21=139/78 & 146/81 138/81 today 05/07/21=147/84, 145/86 04/16/21=164/86, 135/79 04/09/21=146/78, 139/78 Neg proteinuria today, 1+ blood, tr ketones; last sex 2 mo ago; C&S today Denies h/a, scotoma, epigastric pain Denies any HTN issues in past or with pregnancies Spot protein/creat ratio on 04/09/21=240 Suspect chronic HTN  4. Antepartum multigravida of advanced maternal age Will need serial growth u/s q 4 wks due to age and BMI   Preterm labor symptoms and general obstetric precautions including but not limited to vaginal bleeding, contractions, leaking of fluid and fetal movement were reviewed in detail with the patient. Please refer to After Visit Summary for other counseling recommendations.  No follow-ups on file.  Future Appointments  Date Time Provider Department Center  05/25/2021 10:00 AM [facility]  05/28/2021  4:00 PM [facility] PROVIDER [facility] None    Alberteen Spindle, CNM

## 2021-05-10 NOTE — Progress Notes (Signed)
Urine dip reviewed with provider. Urine spot prot/creat and urine culture added to orders.Burt Knack, RN

## 2021-05-11 LAB — TSH: TSH: 3.81 u[IU]/mL (ref 0.450–4.500)

## 2021-05-11 LAB — PROTEIN / CREATININE RATIO, URINE
Creatinine, Urine: 139.9 mg/dL
Protein, Ur: 18.9 mg/dL
Protein/Creat Ratio: 135 mg/g creat (ref 0–200)

## 2021-05-14 LAB — URINE CULTURE

## 2021-05-18 NOTE — Addendum Note (Signed)
Addended by: Burt Knack on: 05/18/2021 11:13 AM   Modules accepted: Orders

## 2021-05-20 ENCOUNTER — Other Ambulatory Visit: Payer: Self-pay | Admitting: Physician Assistant

## 2021-05-20 DIAGNOSIS — O99212 Obesity complicating pregnancy, second trimester: Secondary | ICD-10-CM

## 2021-05-20 DIAGNOSIS — O09523 Supervision of elderly multigravida, third trimester: Secondary | ICD-10-CM

## 2021-05-25 ENCOUNTER — Ambulatory Visit (HOSPITAL_BASED_OUTPATIENT_CLINIC_OR_DEPARTMENT_OTHER): Payer: Medicaid Other | Admitting: Maternal & Fetal Medicine

## 2021-05-25 ENCOUNTER — Other Ambulatory Visit: Payer: Self-pay

## 2021-05-25 ENCOUNTER — Ambulatory Visit: Payer: Medicaid Other | Attending: Maternal & Fetal Medicine

## 2021-05-25 VITALS — BP 147/82 | HR 106 | Temp 98.5°F | Ht 66.0 in | Wt 229.0 lb

## 2021-05-25 DIAGNOSIS — Z3689 Encounter for other specified antenatal screening: Secondary | ICD-10-CM

## 2021-05-25 DIAGNOSIS — O09522 Supervision of elderly multigravida, second trimester: Secondary | ICD-10-CM | POA: Diagnosis not present

## 2021-05-25 DIAGNOSIS — Z3A17 17 weeks gestation of pregnancy: Secondary | ICD-10-CM

## 2021-05-25 DIAGNOSIS — O10912 Unspecified pre-existing hypertension complicating pregnancy, second trimester: Secondary | ICD-10-CM

## 2021-05-25 DIAGNOSIS — O99212 Obesity complicating pregnancy, second trimester: Secondary | ICD-10-CM | POA: Diagnosis not present

## 2021-05-25 DIAGNOSIS — O09523 Supervision of elderly multigravida, third trimester: Secondary | ICD-10-CM

## 2021-05-25 DIAGNOSIS — O321XX0 Maternal care for breech presentation, not applicable or unspecified: Secondary | ICD-10-CM | POA: Insufficient documentation

## 2021-05-25 NOTE — Progress Notes (Signed)
MFM Brief Note  Single intrauterine pregnancy here for a detailed anatomy due to chronic hypertension on low dose ASA and elevated BMI 44 kg/m3. Normal anatomy with measurements consistent with dates There is good fetal movement and amniotic fluid volume Suboptimal views of the fetal anatomy were obtained secondary to fetal position  Her blood pressure today was 157/96 and 147/82 mmHg.  Ms. Muratalla will turn 40 yo at the time of delivery. We discussed the increased risk of FGR, preeclampsia, gestational diabetes and stillbirth. Therefore we recommend serial growth exams every 4 weeks, with inititiation of weekly testing at 36 weeks.   Given her chronic hypertension she has normal PIH labs. Her blood pressure has occasionally been elevated at her provider visits and again today. We recommend initiating antihypertensive therapy if her blood pressure persist 150/105 >mmHg.   Regarding her elevated BMI we recommend 11-20lb weight gain. We discussed similar risk as associated with  gestational diabetes, fetal macrosomia, preeclampsia, but also thrombosis and risk for cesarean delivery.  Recommendations Follow up growth and detailed anatomy in 4 weeks.  Initiate weekly testing at 36 weeks. Serial growth exams every 4 weeks.   I spent 30 minutes > 50% in face to face consultation.  Novella Olive, MD.

## 2021-05-26 ENCOUNTER — Encounter: Payer: Self-pay | Admitting: Family Medicine

## 2021-05-26 ENCOUNTER — Encounter: Payer: Self-pay | Admitting: Advanced Practice Midwife

## 2021-05-28 ENCOUNTER — Ambulatory Visit: Payer: Medicaid Other

## 2021-06-03 NOTE — Progress Notes (Signed)
Patient transfer of care referral faxed with records and U/S x 2, to [facility] on 06/03/2021 at 6pm. Per E. Sciora, referral was to be held until patient was counseled about need for transfer of care due to chronic HTN in pregnancy at her 05/28/2021 appointment. Patient canceled that appointment and is now coming in on 06/08/2021. Per Hazle Coca, referral to be faxed and patient called on 06/04/2021 to explain TOC to patient. Burt Knack, RN

## 2021-06-08 ENCOUNTER — Other Ambulatory Visit: Payer: Self-pay

## 2021-06-08 ENCOUNTER — Ambulatory Visit: Payer: Medicaid Other | Admitting: Family Medicine

## 2021-06-08 VITALS — BP 145/88 | HR 93 | Temp 98.5°F | Wt 226.2 lb

## 2021-06-08 DIAGNOSIS — Z23 Encounter for immunization: Secondary | ICD-10-CM

## 2021-06-08 DIAGNOSIS — O0992 Supervision of high risk pregnancy, unspecified, second trimester: Secondary | ICD-10-CM

## 2021-06-08 DIAGNOSIS — O99210 Obesity complicating pregnancy, unspecified trimester: Secondary | ICD-10-CM

## 2021-06-08 DIAGNOSIS — O162 Unspecified maternal hypertension, second trimester: Secondary | ICD-10-CM

## 2021-06-08 DIAGNOSIS — O0991 Supervision of high risk pregnancy, unspecified, first trimester: Secondary | ICD-10-CM

## 2021-06-08 DIAGNOSIS — O161 Unspecified maternal hypertension, first trimester: Secondary | ICD-10-CM

## 2021-06-08 DIAGNOSIS — O99212 Obesity complicating pregnancy, second trimester: Secondary | ICD-10-CM

## 2021-06-08 LAB — URINALYSIS
Bilirubin, UA: NEGATIVE
Glucose, UA: NEGATIVE
Ketones, UA: NEGATIVE
Leukocytes,UA: NEGATIVE
Nitrite, UA: NEGATIVE
Protein,UA: NEGATIVE
Specific Gravity, UA: 1.005 (ref 1.005–1.030)
Urobilinogen, Ur: 0.2 mg/dL (ref 0.2–1.0)
pH, UA: 6.5 (ref 5.0–7.5)

## 2021-06-08 NOTE — Progress Notes (Signed)
Quad screen declined and declination form signed. Aware of [facility] transfer appt 06/14/2021 at 1330 ([place]) and 1430 (provider appt). Appt reminder card given to client in addition to faciltiy address. Jossie Ng, RN Urine dip reviewed by Elveria Rising FNP-BC. Jossie Ng, RN

## 2021-06-09 ENCOUNTER — Encounter: Payer: Self-pay | Admitting: Family Medicine

## 2021-06-09 NOTE — Progress Notes (Signed)
[facility]  PRENATAL VISIT NOTE  Subjective:  Samantha Dominguez is a 40 y.o. [phone] at [redacted]w[redacted]d being seen today for ongoing prenatal care.  She is currently monitored for the following issues for this high-risk pregnancy and has Obesity affecting pregnancy BMI=39.4; Advanced maternal age in multigravida 40 yo; Chronic hypertension during pregnancy, antepartum; Supervision of high risk pregnancy in first trimester; GERD (gastroesophageal reflux disease); Low-level of literacy (completed first grade); Ptyalism; Rh negative status during pregnancy; Maternal varicella, non-immune; and Abnormal glucose affecting pregnancy 04/09/21: 3 hour GTT=equivocal on their problem list.  Patient reports no complaints.  Contractions: Not present. Vag. Bleeding: None.  Movement: Present. Denies leaking of fluid/ROM.   The following portions of the patient's history were reviewed and updated as appropriate: allergies, current medications, past family history, past medical history, past social history, past surgical history and problem list. Problem list updated.  Objective:   Vitals:   06/08/21 1628  BP: (!) 145/88  Pulse: 93  Temp: 98.5 F (36.9 C)  Weight: 226 lb 3.2 oz (102.6 kg)    Fetal Status: Fetal Heart Rate (bpm): 152 Fundal Height: 20 cm Movement: Present     General:  Alert, oriented and cooperative. Patient is in no acute distress.  Skin: Skin is warm and dry. No rash noted.   Cardiovascular: Normal heart rate noted  Respiratory: Normal respiratory effort, no problems with respiration noted  Abdomen: Soft, gravid, appropriate for gestational age.  Pain/Pressure: Absent     Pelvic: Cervical exam deferred        Extremities: Normal range of motion.  Edema: None  Mental Status: Normal mood and affect. Normal behavior. Normal judgment and thought content.   Assessment and Plan:  Pregnancy: G4P3003 at [redacted]w[redacted]d  1. Supervision of high risk pregnancy  in first trimester -taking PNV as directed  -pt aware of appointment of transfer of care to [facility] 06/14/2021 -anatomy U/S on 05/25/2021- WNL  -patient had questions about medications.  Pt was pt reports that she is taking 2 medications one ASA and a medication that was for glucose.  Informed pt that she was not currently prescribed medication for glucose from this office.  Pt reports she was a paper with pictures on it and she bought the medication we told her to take and "the Dr at the ultrasound told her to take an additional medication".  Pt was showed the medications for ASA that we give patients here.  Requested patient to bring paper or medications that she is taking, so provider can see what medications she is taking.  Pt verbalizes understanding.  - Urinalysis (Urine Dip)  2. Obesity affecting pregnancy, antepartum Denies exercise -discussed importance of exercise with pregnacy  3. Elevated blood pressure affecting pregnancy in first trimester 04/09/21=139/78 & 146/81 Pt  blood pressures today were 147/82 and 145/88 Pt denies any s/sx related to HTN or preeclampsia (H/A, n/V, dizziness, Scotoma, edema Upper gastric pain).     Preterm labor symptoms and general obstetric precautions including but not limited to vaginal bleeding, contractions, leaking of fluid and fetal movement were reviewed in detail with the patient. Please refer to After Visit Summary for other counseling recommendations.  Return in about 4 weeks (around 07/06/2021) for routine prenatal care.  Future Appointments  Date Time Provider Department Center  06/24/2021  4:00 PM [facility]   Teressa Senter used for Spanish interpretation.     Wendi Snipes, FNP

## 2021-06-09 NOTE — Progress Notes (Unsigned)
Pt returned to clinic AM with medications she was taking.  See note from 1 day ago.  Patient was taking two 81 mg ASA daily.    Pt instructed to only take one 81 mg ASA.  Pt educated on the purpose of the 81 mg ASA.  If patient has any questions about medication to contact MH clinic.    Pt verbalizes understanding.    M. [place] used for Spanish interpretation.     Wendi Snipes, FNP

## 2021-06-22 ENCOUNTER — Other Ambulatory Visit: Payer: Self-pay

## 2021-06-22 DIAGNOSIS — O10912 Unspecified pre-existing hypertension complicating pregnancy, second trimester: Secondary | ICD-10-CM

## 2021-06-22 DIAGNOSIS — O09522 Supervision of elderly multigravida, second trimester: Secondary | ICD-10-CM

## 2021-06-24 ENCOUNTER — Ambulatory Visit: Payer: Medicaid Other

## 2021-08-17 ENCOUNTER — Ambulatory Visit: Payer: Medicaid Other | Attending: Obstetrics and Gynecology

## 2021-08-17 ENCOUNTER — Other Ambulatory Visit: Payer: Self-pay

## 2021-08-17 ENCOUNTER — Ambulatory Visit: Payer: Medicaid Other | Admitting: *Deleted

## 2021-08-17 ENCOUNTER — Other Ambulatory Visit: Payer: Self-pay | Admitting: *Deleted

## 2021-08-17 VITALS — BP 139/84 | HR 98

## 2021-08-17 DIAGNOSIS — Z3A00 Weeks of gestation of pregnancy not specified: Secondary | ICD-10-CM | POA: Insufficient documentation

## 2021-08-17 DIAGNOSIS — O09522 Supervision of elderly multigravida, second trimester: Secondary | ICD-10-CM | POA: Insufficient documentation

## 2021-08-17 DIAGNOSIS — O99810 Abnormal glucose complicating pregnancy: Secondary | ICD-10-CM

## 2021-08-17 DIAGNOSIS — Z3490 Encounter for supervision of normal pregnancy, unspecified, unspecified trimester: Secondary | ICD-10-CM | POA: Insufficient documentation

## 2021-08-17 DIAGNOSIS — O26893 Other specified pregnancy related conditions, third trimester: Secondary | ICD-10-CM

## 2021-08-17 DIAGNOSIS — O09899 Supervision of other high risk pregnancies, unspecified trimester: Secondary | ICD-10-CM

## 2021-08-17 DIAGNOSIS — O99213 Obesity complicating pregnancy, third trimester: Secondary | ICD-10-CM

## 2021-08-17 DIAGNOSIS — O10912 Unspecified pre-existing hypertension complicating pregnancy, second trimester: Secondary | ICD-10-CM | POA: Insufficient documentation

## 2021-08-17 DIAGNOSIS — O10919 Unspecified pre-existing hypertension complicating pregnancy, unspecified trimester: Secondary | ICD-10-CM

## 2021-08-17 DIAGNOSIS — O09523 Supervision of elderly multigravida, third trimester: Secondary | ICD-10-CM

## 2021-08-17 DIAGNOSIS — O24419 Gestational diabetes mellitus in pregnancy, unspecified control: Secondary | ICD-10-CM

## 2021-08-25 ENCOUNTER — Ambulatory Visit: Payer: Medicaid Other | Admitting: *Deleted

## 2021-09-02 ENCOUNTER — Other Ambulatory Visit: Payer: Self-pay | Admitting: Obstetrics and Gynecology

## 2021-09-02 ENCOUNTER — Encounter: Payer: Self-pay | Admitting: Obstetrics and Gynecology

## 2021-09-02 ENCOUNTER — Observation Stay
Admission: EM | Admit: 2021-09-02 | Discharge: 2021-09-03 | Disposition: A | Discharge status: Home / Self Care | Payer: Medicaid Other | Attending: Obstetrics | Admitting: Obstetrics

## 2021-09-02 ENCOUNTER — Other Ambulatory Visit: Payer: Self-pay

## 2021-09-02 DIAGNOSIS — O1503 Eclampsia in pregnancy, third trimester: Principal | ICD-10-CM | POA: Insufficient documentation

## 2021-09-02 DIAGNOSIS — O99213 Obesity complicating pregnancy, third trimester: Secondary | ICD-10-CM | POA: Diagnosis present

## 2021-09-02 DIAGNOSIS — O99810 Abnormal glucose complicating pregnancy: Secondary | ICD-10-CM

## 2021-09-02 DIAGNOSIS — O09523 Supervision of elderly multigravida, third trimester: Secondary | ICD-10-CM

## 2021-09-02 DIAGNOSIS — Z2839 Other underimmunization status: Secondary | ICD-10-CM

## 2021-09-02 DIAGNOSIS — U071 COVID-19: Secondary | ICD-10-CM | POA: Diagnosis not present

## 2021-09-02 DIAGNOSIS — Z6791 Unspecified blood type, Rh negative: Principal | ICD-10-CM

## 2021-09-02 DIAGNOSIS — O368330 Maternal care for abnormalities of the fetal heart rate or rhythm, third trimester, not applicable or unspecified: Secondary | ICD-10-CM | POA: Diagnosis present

## 2021-09-02 DIAGNOSIS — R519 Headache, unspecified: Secondary | ICD-10-CM | POA: Diagnosis present

## 2021-09-02 DIAGNOSIS — O10919 Unspecified pre-existing hypertension complicating pregnancy, unspecified trimester: Secondary | ICD-10-CM

## 2021-09-02 DIAGNOSIS — O26893 Other specified pregnancy related conditions, third trimester: Secondary | ICD-10-CM

## 2021-09-02 DIAGNOSIS — K219 Gastro-esophageal reflux disease without esophagitis: Secondary | ICD-10-CM | POA: Diagnosis present

## 2021-09-02 DIAGNOSIS — O119 Pre-existing hypertension with pre-eclampsia, unspecified trimester: Secondary | ICD-10-CM

## 2021-09-02 DIAGNOSIS — O24419 Gestational diabetes mellitus in pregnancy, unspecified control: Secondary | ICD-10-CM

## 2021-09-02 DIAGNOSIS — O98513 Other viral diseases complicating pregnancy, third trimester: Secondary | ICD-10-CM | POA: Diagnosis present

## 2021-09-02 DIAGNOSIS — Z3A32 32 weeks gestation of pregnancy: Secondary | ICD-10-CM | POA: Insufficient documentation

## 2021-09-02 DIAGNOSIS — O113 Pre-existing hypertension with pre-eclampsia, third trimester: Principal | ICD-10-CM | POA: Diagnosis present

## 2021-09-02 DIAGNOSIS — O09899 Supervision of other high risk pregnancies, unspecified trimester: Secondary | ICD-10-CM

## 2021-09-02 HISTORY — DX: Gestational diabetes mellitus in pregnancy, unspecified control: O24.419

## 2021-09-02 HISTORY — DX: Unspecified pre-existing hypertension complicating pregnancy, unspecified trimester: O10.919

## 2021-09-02 LAB — COMPREHENSIVE METABOLIC PANEL
ALT: 46 U/L — ABNORMAL HIGH (ref 0–44)
AST: 32 U/L (ref 15–41)
Albumin: 3.2 g/dL — ABNORMAL LOW (ref 3.5–5.0)
Alkaline Phosphatase: 145 U/L — ABNORMAL HIGH (ref 38–126)
Anion gap: 9 (ref 5–15)
BUN: 7 mg/dL (ref 6–20)
CO2: 20 mmol/L — ABNORMAL LOW (ref 22–32)
Calcium: 8.8 mg/dL — ABNORMAL LOW (ref 8.9–10.3)
Chloride: 105 mmol/L (ref 98–111)
Creatinine, Ser: 0.53 mg/dL (ref 0.44–1.00)
GFR, Estimated: >60 mL/min (ref >60)
Glucose, Bld: 86 mg/dL (ref 70–99)
Potassium: 3.9 mmol/L (ref 3.5–5.1)
Sodium: 134 mmol/L — ABNORMAL LOW (ref 135–145)
Total Bilirubin: 0.7 mg/dL (ref 0.3–1.2)
Total Protein: 7.6 g/dL (ref 6.5–8.1)

## 2021-09-02 LAB — CBC
HCT: 37 % (ref 36.0–46.0)
Hemoglobin: 12.6 g/dL (ref 12.0–15.0)
MCH: 26.9 pg (ref 26.0–34.0)
MCHC: 34.1 g/dL (ref 30.0–36.0)
MCV: 79.1 fL — ABNORMAL LOW (ref 80.0–100.0)
Platelets: 258 10*3/uL (ref 150–400)
RBC: 4.68 MIL/uL (ref 3.87–5.11)
RDW: 14.6 % (ref 11.5–15.5)
WBC: 7.7 10*3/uL (ref 4.0–10.5)
nRBC: 0 % (ref 0.0–0.2)

## 2021-09-02 LAB — RESP PANEL BY RT-PCR (FLU A&B, COVID) ARPGX2
Influenza A by PCR: NEGATIVE
Influenza B by PCR: NEGATIVE
SARS Coronavirus 2 by RT PCR: POSITIVE — AB

## 2021-09-02 LAB — GLUCOSE, CAPILLARY
Glucose-Capillary: 85 mg/dL (ref 70–99)
Glucose-Capillary: 163 mg/dL — ABNORMAL HIGH (ref 70–99)

## 2021-09-02 LAB — PROTEIN / CREATININE RATIO, URINE
Creatinine, Urine: 25 mg/dL
Protein Creatinine Ratio: 0.96 mg/mg{Cre} — ABNORMAL HIGH (ref 0.00–0.15)
Total Protein, Urine: 24 mg/dL

## 2021-09-02 MED ORDER — MAGNESIUM SULFATE 40 GM/1000ML IV SOLN
2.0000 g/h | INTRAVENOUS | Status: DC
  Filled 2021-09-02: qty 1000

## 2021-09-02 MED ORDER — LACTATED RINGERS IV SOLN: INTRAVENOUS | Status: DC

## 2021-09-02 MED ORDER — HYDRALAZINE HCL 20 MG/ML IJ SOLN: 10.0000 mg | INTRAMUSCULAR | Status: DC | PRN

## 2021-09-02 MED ORDER — LABETALOL HCL 5 MG/ML IV SOLN: 80.0000 mg | INTRAVENOUS | Status: DC | PRN

## 2021-09-02 MED ORDER — NIRMATRELVIR/RITONAVIR (PAXLOVID)TABLET
3.0000 | ORAL_TABLET | Freq: Two times a day (BID) | ORAL | Status: DC
  Administered 2021-09-02 – 2021-09-03 (×2): 3 via ORAL
  Filled 2021-09-02: qty 3

## 2021-09-02 MED ORDER — MAGNESIUM SULFATE BOLUS VIA INFUSION
4.0000 g | Freq: Once | INTRAVENOUS | Status: AC
  Administered 2021-09-02: 4 g via INTRAVENOUS
  Filled 2021-09-02: qty 1000

## 2021-09-02 MED ORDER — LABETALOL HCL 5 MG/ML IV SOLN: 20.0000 mg | INTRAVENOUS | Status: DC | PRN

## 2021-09-02 MED ORDER — CALCIUM GLUCONATE 10 % IV SOLN
INTRAVENOUS | Status: AC
  Filled 2021-09-02: qty 10

## 2021-09-02 MED ORDER — LABETALOL HCL 5 MG/ML IV SOLN: 40.0000 mg | INTRAVENOUS | Status: DC | PRN

## 2021-09-02 MED ORDER — ACETAMINOPHEN 325 MG PO TABS
650.0000 mg | ORAL_TABLET | ORAL | Status: DC | PRN
  Administered 2021-09-03: 650 mg via ORAL
  Filled 2021-09-02: qty 2

## 2021-09-02 MED ORDER — NIFEDIPINE ER OSMOTIC RELEASE 30 MG PO TB24
60.0000 mg | ORAL_TABLET | Freq: Every day | ORAL | Status: DC
  Administered 2021-09-02: 60 mg via ORAL
  Filled 2021-09-02: qty 2

## 2021-09-02 MED ORDER — BETAMETHASONE SOD PHOS & ACET 6 (3-3) MG/ML IJ SUSP
12.0000 mg | INTRAMUSCULAR | Status: AC
  Administered 2021-09-02 – 2021-09-03 (×2): 12 mg via INTRAMUSCULAR
  Filled 2021-09-02 (×2): qty 5

## 2021-09-02 MED ORDER — METFORMIN HCL ER 500 MG PO TB24
500.0000 mg | ORAL_TABLET | Freq: Two times a day (BID) | ORAL | Status: DC
  Administered 2021-09-03: 500 mg via ORAL
  Filled 2021-09-02: qty 1

## 2021-09-02 NOTE — Progress Notes (Signed)
Sent to triage from the office for NRNST

## 2021-09-02 NOTE — H&P (Signed)
OB History & Physical   History of Present Illness:  Chief Complaint:   HPI:  Samantha Dominguez is a 41 y.o. G43P3003 female at [redacted]w[redacted]d dated by 12 week u/s.  She presents to L&D for  Non-reactive NST in the office Uncontrolled CHTN GDM  09/02/2021 Prenatal Visit NST - non-reactive U/S - EFW 4lb1oz (1829g) = 49%; AFI 11.58cm = 28%; VTX, Post placenta   She reports:  -active fetal movement -no HA, vision changes, epigastric pain  Pregnancy Issues: 1. CHTN 2. GDM 3. Obesity 4. Rh neg 5. Varicella non-immune 6. Low literacy  MFM appt with Dr. Grace Bushy completed on 08/17/21 - see note for details  Maternal Medical History:   Past Medical History:  Diagnosis Date   Chronic hypertension affecting pregnancy 09/02/2021   GDM (gestational diabetes mellitus) 09/02/2021   GERD (gastroesophageal reflux disease) 04/08/2021   states burning sensation and nausea after eats   Headache 04/08/2021   states occasional headache    Past Surgical History:  Procedure Laterality Date   denies     NO PAST SURGERIES      No Known Allergies  Prior to Admission medications   Medication Sig Start Date End Date Taking? Authorizing Provider  aspirin EC 81 MG tablet Take 81 mg by mouth daily. Swallow whole.   Yes [provider]  prenatal vitamin w/FE, FA (PRENATAL 1 + 1) 27-1 MG TABS tablet Take 1 tablet by mouth daily at 12 noon.   Yes [provider]     Prenatal care site: [facility]   Social History: She  reports that she has never smoked. She has never been exposed to tobacco smoke. She has never used smokeless tobacco. She reports that she does not currently use alcohol. She reports that she does not use drugs.  Family History: family history includes Healthy in her brother, brother, brother, daughter, daughter, mother, and son.   Review of Systems: A full review of systems was performed and negative except as noted in the HPI.    Physical Exam:  Vital  Signs: BP 138/76    Pulse 88    Temp 98.7 F (37.1 C) (Oral)    Resp 16    Ht 5\' 4"  (1.626 m)    Wt 103.4 kg    LMP 01/11/2021 (Exact Date)    BMI 39.14 kg/m   General:   alert and cooperative  Skin:  normal  Neurologic:    Alert & oriented x 3  Lungs:    Nl effort  Heart:   regular rate and rhythm  Abdomen:  soft, non-tender; bowel sounds normal; no masses,  no organomegaly  Extremities: : non-tender, symmetric, no edema bilaterally.  DTRs: 2+     Results for orders placed or performed during the hospital encounter of 09/02/21 (from the past 24 hour(s))  CBC     Status: Abnormal   Collection Time: 09/02/21  3:07 PM  Result Value Ref Range   WBC 7.7 4.0 - 10.5 K/uL   RBC 4.68 3.87 - 5.11 MIL/uL   Hemoglobin 12.6 12.0 - 15.0 g/dL   HCT 10/31/21 53.0 - 05.1 %   MCV 79.1 (L) 80.0 - 100.0 fL   MCH 26.9 26.0 - 34.0 pg   MCHC 34.1 30.0 - 36.0 g/dL   RDW 10.2 11.1 - 73.5 %   Platelets 258 150 - 400 K/uL   nRBC 0.0 0.0 - 0.2 %  Comprehensive metabolic panel     Status: Abnormal  Collection Time: 09/02/21  3:07 PM  Result Value Ref Range   Sodium 134 (L) 135 - 145 mmol/L   Potassium 3.9 3.5 - 5.1 mmol/L   Chloride 105 98 - 111 mmol/L   CO2 20 (L) 22 - 32 mmol/L   Glucose, Bld 86 70 - 99 mg/dL   BUN 7 6 - 20 mg/dL   Creatinine, Ser 1.02 0.44 - 1.00 mg/dL   Calcium 8.8 (L) 8.9 - 10.3 mg/dL   Total Protein 7.6 6.5 - 8.1 g/dL   Albumin 3.2 (L) 3.5 - 5.0 g/dL   AST 32 15 - 41 U/L   ALT 46 (H) 0 - 44 U/L   Alkaline Phosphatase 145 (H) 38 - 126 U/L   Total Bilirubin 0.7 0.3 - 1.2 mg/dL   GFR, Estimated >72 >53 mL/min   Anion gap 9 5 - 15  Glucose, capillary     Status: None   Collection Time: 09/02/21  3:07 PM  Result Value Ref Range   Glucose-Capillary 85 70 - 99 mg/dL  Protein / creatinine ratio, urine     Status: Abnormal   Collection Time: 09/02/21  3:14 PM  Result Value Ref Range   Creatinine, Urine 25 mg/dL   Total Protein, Urine 24 mg/dL   Protein Creatinine Ratio 0.96 (H)  0.00 - 0.15 mg/mg[Cre]  Resp Panel by RT-PCR (Flu A&B, Covid) Nasopharyngeal Swab     Status: Abnormal   Collection Time: 09/02/21  5:06 PM   Specimen: Nasopharyngeal Swab; Nasopharyngeal(NP) swabs in vial transport medium  Result Value Ref Range   SARS Coronavirus 2 by RT PCR POSITIVE (A) NEGATIVE   Influenza A by PCR NEGATIVE NEGATIVE   Influenza B by PCR NEGATIVE NEGATIVE    Pertinent Results:  Prenatal Labs: Blood type/Rh O neg  Antibody screen neg  Rubella Immune  Varicella Non-Immune  RPR NR  HBsAg Neg  HIV NR  GC neg  Chlamydia neg  Genetic screening negative  1 hour GTT   3 hour GTT 105- 210-195-114  GBS    FHT: FHR: 150 bpm, variability: moderate,  accelerations:  Present,  decelerations:  Absent Category/reactivity:  Category I TOCO: none   Assessment:  Samantha Dominguez is a 41 y.o. G60P3003 female at [redacted]w[redacted]d with CHTN with superimposed Pre-E.   Plan:  1. Admit to Labor & Delivery for Obs; consents reviewed and obtained  2. CHTN with superimposed Pre-E - Mag Sulfate prophylaxis initiated  Consider 24-48 hours depending on BPs at 24 hours  Consider restarting Procardia after Mag is d/c'd - Conditional Labetalol ordered for severe range BPs - Repeat labs in the am - Strict I&O  3. Fetal Well being  - Betamethasone x2 doses q24hrs - Fetal Tracing: Cat I - Continuous monitoring  4. GDM - CBGs QID  - Carb modified diet - May consider starting Metformin if CBGs show abnormality  - Consult with DM coordinator for education  5. Covid - Paxlovid started  6. Low literacy, limited resources - Transition of Care Team consult  Quillian Quince 09/02/2021 7:51 PM

## 2021-09-02 NOTE — TOC Initial Note (Addendum)
Transition of Care [facility]) - Initial/Assessment Note    Patient Details  Name: Samantha Dominguez MRN: [id number] Date of Birth: [birth year]  Transition of Care [facility] CM/SW Contact:    Anselm Pancoast, RN Phone Number: 09/02/2021, 3:50 PM  Clinical Narrative:                 TOC notified patient had been sent over to hospital from MD office and had 2 children 10,2 with her and no other support system. FOB is en route to hospital and children remain at patients bedside. Patient and children are spanish speaking. FOB will watch children while patient in hospital. Nurses identified children had not eaten and provided food. TOC outreached to [facility] where patient receives prenatal care for follow up after discharge.   Spoke with Levada Dy @ [facility] confirmed patient is engaged with Mindi Slicker at health department and last discussion was at 08/19/21. Vito Backers will follow up with patient after discharge.         Patient Goals and CMS Choice        Expected Discharge Plan and Services                                                Prior Living Arrangements/Services                       Activities of Daily Living      Permission Sought/Granted                  Emotional Assessment              Admission diagnosis:  Chronic hypertension affecting pregnancy [O10.919] Patient Active Problem List   Diagnosis Date Noted   Chronic hypertension affecting pregnancy 09/02/2021   Rh negative status during pregnancy 04/16/2021   Maternal varicella, non-immune 04/16/2021   Abnormal glucose affecting pregnancy 04/09/21: 3 hour GTT=equivocal 04/16/2021   Obesity affecting pregnancy BMI=39.4 04/09/2021   Advanced maternal age in multigravida 41 yo 04/09/2021   Chronic hypertension during pregnancy, antepartum 04/09/2021   Supervision of high risk pregnancy in first trimester 04/09/2021   GERD (gastroesophageal reflux disease)  04/09/2021   Low-level of literacy (completed first grade) 04/09/2021   Ptyalism 04/09/2021   PCP:  Kathyrn Lass Pharmacy:   [facility] [address]), [place] [address] [place]) [place] [address] Phone: [phone] Fax: [phone]     Social Determinants of Health (SDOH) Interventions    Readmission Risk Interventions No flowsheet data found.

## 2021-09-02 NOTE — Progress Notes (Signed)
Spoke to Dr. Feliberto Gottron for discuss POC - Will start Mag Sulfate x24-48hrs - Will stop Procardia - Continue with plan of BMX x2 doses q24hrs - Will treat elevated BPs with conditional Labetalol order - Continue with plan to test CBGs QID  [name] 09/02/2021 7:33 PM

## 2021-09-02 NOTE — OB Triage Note (Signed)
Patient is G4P3, [redacted]w[redacted]d that was sent over from the provider's office for non-reactive NST. Patient denies any bleeding or LOF. Patient states that she feels +FM. Patient denies headache, no RUQ pain and any visual disturbances. Monitors applied and assessing. Used hospital in person interpreter [facility] for triage.

## 2021-09-02 NOTE — Discharge Summary (Incomplete)
Patient ID: Samantha Dominguez MRN: [id number] DOB/AGE: September 07, 1980 40 y.o.  Admit date: 09/02/2021 Discharge date: 09/02/2021  Admission Diagnoses: 40yo, G4P3 at 107w1d sent from the office for non-reactive NST and BP/glucose evaluation.  Factors complicating pregnancy: Chronic HTN GDM AMA Obesity Spanish speaker and unable to read or write  Discharge Diagnoses: ***  Prenatal Procedures: ***  Consults: ***  Significant Diagnostic Studies:  Results for orders placed or performed during the hospital encounter of 09/02/21 (from the past 168 hour(s))  CBC   Collection Time: 09/02/21  3:07 PM  Result Value Ref Range   WBC 7.7 4.0 - 10.5 K/uL   RBC 4.68 3.87 - 5.11 MIL/uL   Hemoglobin 12.6 12.0 - 15.0 g/dL   HCT 40.9 81.1 - 91.4 %   MCV 79.1 (L) 80.0 - 100.0 fL   MCH 26.9 26.0 - 34.0 pg   MCHC 34.1 30.0 - 36.0 g/dL   RDW 78.2 95.6 - 21.3 %   Platelets 258 150 - 400 K/uL   nRBC 0.0 0.0 - 0.2 %  Comprehensive metabolic panel   Collection Time: 09/02/21  3:07 PM  Result Value Ref Range   Sodium 134 (L) 135 - 145 mmol/L   Potassium 3.9 3.5 - 5.1 mmol/L   Chloride 105 98 - 111 mmol/L   CO2 20 (L) 22 - 32 mmol/L   Glucose, Bld 86 70 - 99 mg/dL   BUN 7 6 - 20 mg/dL   Creatinine, Ser 0.86 0.44 - 1.00 mg/dL   Calcium 8.8 (L) 8.9 - 10.3 mg/dL   Total Protein 7.6 6.5 - 8.1 g/dL   Albumin 3.2 (L) 3.5 - 5.0 g/dL   AST 32 15 - 41 U/L   ALT 46 (H) 0 - 44 U/L   Alkaline Phosphatase 145 (H) 38 - 126 U/L   Total Bilirubin 0.7 0.3 - 1.2 mg/dL   GFR, Estimated >57 >84 mL/min   Anion gap 9 5 - 15  Glucose, capillary   Collection Time: 09/02/21  3:07 PM  Result Value Ref Range   Glucose-Capillary 85 70 - 99 mg/dL  Protein / creatinine ratio, urine   Collection Time: 09/02/21  3:14 PM  Result Value Ref Range   Creatinine, Urine 25 mg/dL   Total Protein, Urine 24 mg/dL   Protein Creatinine Ratio 0.96 (H) 0.00 - 0.15 mg/mg[Cre]  Resp Panel by RT-PCR (Flu A&B, Covid) Nasopharyngeal Swab    Collection Time: 09/02/21  5:06 PM   Specimen: Nasopharyngeal Swab; Nasopharyngeal(NP) swabs in vial transport medium  Result Value Ref Range   SARS Coronavirus 2 by RT PCR POSITIVE (A) NEGATIVE   Influenza A by PCR NEGATIVE NEGATIVE   Influenza B by PCR NEGATIVE NEGATIVE    Treatments: {Tx:18249}  Hospital Course:  This is a 41 y.o. O9G2952 with IUP at [redacted]w[redacted]d seen for non-reactive NST and BP/glucose evaluation.    CHTN with superimposed Pre-E Procardia 30mg  first prescribed for pt in 06/14/21 - today reports she is not taking  Pt is currently taking 81mg  ASA BPs prenatally 126-164/68-94 BPs on admission 137-172/71-107 Labs: PCR 960; ALT 46 Will monitor for Pre-E sxs and monitor BPs   GDM Pt referred to [facility] for DM/nutrition teaching - has not been, pt reports she can not monitor BSs because she can't read or write No prenatal logs available Random CBG on admission:85   Will collect CBGs      noted to have a cervical exam of ***.  No leaking of fluid and no  bleeding.  She was initially started on magnesium sulfate for tocolysis and neuroprotection and also received betamethasone x 2 doses.  Her tocolysis was transitioned to Procardia. She was seen by Neonatology during her stay.  She was observed, fetal heart rate monitoring remained reassuring, and she had no signs/symptoms of progressing preterm labor or other maternal-fetal concerns.  Her cervical exam was unchanged from admission.  She was deemed stable for discharge to home with outpatient follow up.  Discharge Physical Exam: *** BP (!) 141/71    Pulse 85    Temp 98.4 F (36.9 C) (Oral)    Resp 18    Ht 5\' 4"  (1.626 m)    Wt 103.4 kg    LMP 01/11/2021 (Exact Date)    BMI 39.14 kg/m   General: NAD CV: RRR Pulm: CTABL, nl effort ABD: s/nd/nt, gravid DVT Evaluation: LE non-ttp, no evidence of DVT on exam.  NST: FHR baseline: *** bpm Variability: {:312973} Accelerations: {Responses;  yes/no/wildcard:311178} Decelerations: {:310437} Time: *** minutes Category/reactivity: {:312980} Amniotic Fluid Index: {:312981} Biophysical Profile: {:312812} Fetal evaluation comments: ***  TOCO: quiet SVE: deferred      Discharge Condition: Stable  Disposition:    Allergies as of 09/02/2021   No Known Allergies   Med Rec must be completed prior to using this [facility]***        SignedSELECT [facility], CNM 09/02/2021 6:47 PM

## 2021-09-03 DIAGNOSIS — O368330 Maternal care for abnormalities of the fetal heart rate or rhythm, third trimester, not applicable or unspecified: Secondary | ICD-10-CM | POA: Diagnosis present

## 2021-09-03 DIAGNOSIS — U071 COVID-19: Secondary | ICD-10-CM | POA: Diagnosis present

## 2021-09-03 DIAGNOSIS — K219 Gastro-esophageal reflux disease without esophagitis: Secondary | ICD-10-CM | POA: Diagnosis present

## 2021-09-03 DIAGNOSIS — O09523 Supervision of elderly multigravida, third trimester: Secondary | ICD-10-CM | POA: Diagnosis not present

## 2021-09-03 DIAGNOSIS — R519 Headache, unspecified: Secondary | ICD-10-CM | POA: Diagnosis present

## 2021-09-03 DIAGNOSIS — O99213 Obesity complicating pregnancy, third trimester: Secondary | ICD-10-CM | POA: Diagnosis present

## 2021-09-03 DIAGNOSIS — O113 Pre-existing hypertension with pre-eclampsia, third trimester: Secondary | ICD-10-CM | POA: Diagnosis present

## 2021-09-03 DIAGNOSIS — O24419 Gestational diabetes mellitus in pregnancy, unspecified control: Secondary | ICD-10-CM | POA: Diagnosis present

## 2021-09-03 DIAGNOSIS — O26893 Other specified pregnancy related conditions, third trimester: Secondary | ICD-10-CM | POA: Diagnosis present

## 2021-09-03 DIAGNOSIS — Z3A32 32 weeks gestation of pregnancy: Secondary | ICD-10-CM | POA: Diagnosis not present

## 2021-09-03 DIAGNOSIS — O98513 Other viral diseases complicating pregnancy, third trimester: Secondary | ICD-10-CM | POA: Diagnosis present

## 2021-09-03 DIAGNOSIS — O1503 Eclampsia in pregnancy, third trimester: Secondary | ICD-10-CM | POA: Diagnosis not present

## 2021-09-03 LAB — COMPREHENSIVE METABOLIC PANEL
ALT: 58 U/L — ABNORMAL HIGH (ref 0–44)
AST: 40 U/L (ref 15–41)
Albumin: 3.2 g/dL — ABNORMAL LOW (ref 3.5–5.0)
Alkaline Phosphatase: 143 U/L — ABNORMAL HIGH (ref 38–126)
Anion gap: 10 (ref 5–15)
BUN: 8 mg/dL (ref 6–20)
CO2: 20 mmol/L — ABNORMAL LOW (ref 22–32)
Calcium: 7.8 mg/dL — ABNORMAL LOW (ref 8.9–10.3)
Chloride: 103 mmol/L (ref 98–111)
Creatinine, Ser: 0.47 mg/dL (ref 0.44–1.00)
GFR, Estimated: >60 mL/min (ref >60)
Glucose, Bld: 135 mg/dL — ABNORMAL HIGH (ref 70–99)
Potassium: 3.9 mmol/L (ref 3.5–5.1)
Sodium: 133 mmol/L — ABNORMAL LOW (ref 135–145)
Total Bilirubin: 0.7 mg/dL (ref 0.3–1.2)
Total Protein: 7.4 g/dL (ref 6.5–8.1)

## 2021-09-03 LAB — GLUCOSE, CAPILLARY
Glucose-Capillary: 143 mg/dL — ABNORMAL HIGH (ref 70–99)
Glucose-Capillary: 203 mg/dL — ABNORMAL HIGH (ref 70–99)
Glucose-Capillary: 169 mg/dL — ABNORMAL HIGH (ref 70–99)

## 2021-09-03 LAB — CBC WITH DIFFERENTIAL/PLATELET
Abs Immature Granulocytes: 0.06 10*3/uL (ref 0.00–0.07)
Basophils Absolute: 0 10*3/uL (ref 0.0–0.1)
Basophils Relative: 0 %
Eosinophils Absolute: 0 10*3/uL (ref 0.0–0.5)
Eosinophils Relative: 0 %
HCT: 36.5 % (ref 36.0–46.0)
Hemoglobin: 12.4 g/dL (ref 12.0–15.0)
Immature Granulocytes: 1 %
Lymphocytes Relative: 12 %
Lymphs Abs: 1.2 10*3/uL (ref 0.7–4.0)
MCH: 26.8 pg (ref 26.0–34.0)
MCHC: 34 g/dL (ref 30.0–36.0)
MCV: 78.8 fL — ABNORMAL LOW (ref 80.0–100.0)
Monocytes Absolute: 0.2 10*3/uL (ref 0.1–1.0)
Monocytes Relative: 2 %
Neutro Abs: 8.8 10*3/uL — ABNORMAL HIGH (ref 1.7–7.7)
Neutrophils Relative %: 85 %
Platelets: 261 10*3/uL (ref 150–400)
RBC: 4.63 MIL/uL (ref 3.87–5.11)
RDW: 14.6 % (ref 11.5–15.5)
WBC: 10.4 10*3/uL (ref 4.0–10.5)
nRBC: 0 % (ref 0.0–0.2)

## 2021-09-03 MED ORDER — METFORMIN HCL ER 500 MG PO TB24: 500.0000 mg | ORAL_TABLET | Freq: Two times a day (BID) | ORAL | 0 refills | Status: DC

## 2021-09-03 MED ORDER — INSULIN NPH (HUMAN) (ISOPHANE) 100 UNIT/ML ~~LOC~~ SUSP: 0.0000 [IU] | Freq: Three times a day (TID) | SUBCUTANEOUS | Status: DC

## 2021-09-03 MED ORDER — NIFEDIPINE ER OSMOTIC RELEASE 30 MG PO TB24
60.0000 mg | ORAL_TABLET | Freq: Every day | ORAL | Status: DC
  Filled 2021-09-03: qty 2

## 2021-09-03 MED ORDER — NIFEDIPINE ER OSMOTIC RELEASE 30 MG PO TB24
30.0000 mg | ORAL_TABLET | Freq: Two times a day (BID) | ORAL | Status: DC
  Administered 2021-09-03: 30 mg via ORAL

## 2021-09-03 MED ORDER — NIFEDIPINE ER 30 MG PO TB24: 30.0000 mg | ORAL_TABLET | Freq: Two times a day (BID) | ORAL | 0 refills | Status: DC

## 2021-09-03 MED ORDER — GLYBURIDE 2.5 MG PO TABS: 2.5000 mg | ORAL_TABLET | Freq: Every evening | ORAL | 0 refills | Status: DC

## 2021-09-03 MED ORDER — INSULIN ASPART 100 UNIT/ML IJ SOLN: 0.0000 [IU] | Freq: Three times a day (TID) | INTRAMUSCULAR | Status: DC

## 2021-09-03 NOTE — Progress Notes (Signed)
Discharged home. Discharge instructions reviewed with patient through interpreter by both Rubye Oaks, CNM and RN. Medications reviewed in depth. Teach-back method used, again with interpreter. Left floor via wheelchair escorted by scrub tech. Samantha Dominguez

## 2021-09-03 NOTE — Progress Notes (Signed)
I spoke to Dr Judeth Cornfield from MFM, I asked him to review the patients chart and provide recommendations. He suggested that the Magnesium be cut off and patient be monitored for BP for a few hours and then d/c home if BP remains stable. Dr Judeth Cornfield reviewed labs and does not feel that her labs/BP indicates Pre-E with severe features. He states she does have pre-e but could be monitored on an outpatient basis and does not warrant inpatient treatment at this time.    Chari Manning CNM

## 2021-09-03 NOTE — Progress Notes (Addendum)
Inpatient Diabetes Program Recommendations  ADA Standards of Care 2023 Diabetes in Pregnancy Target Glucose Ranges:  Fasting: 70 - 95 mg/dL 1 hr postprandial:  110 - 17m/dL (from first bite of meal) 2 hr postprandial:  100 - 120 mg/dL (from first bit of meal)    Lab Results  Component Value Date   GLUCAP 163 (H) 09/02/2021   HGBA1C 5.4 04/09/2021    Review of Glycemic Control  Gestational Diabetes- 332w1dOutpatient Diabetes medications: None Current orders for Inpatient glycemic control: Metformin 500 mg bid, Betamethasone 12 mg q 24 hrs.  Noted Metformin added this am and CBG monitoring ordered to start @ 10 am this am. Plan to review with patient gestational diabetes today.See below. Will review CBGs and follow during hospitalization and make recommendations as needed.  While pt.is on steroids in the hospital, recommend 0-14 postprandial correction scale. When discharged probably only need Metformin with dietary changes since admitting CBG 85.  12:30 Met with patient @ bedside using stratus video interpretor #[phone] gestational diabetes. Patient is unable to read so did not have support in family to accept resource materials. Discussed importance of DM control during pregnancy. Discussed basic pathophysiology of GDM and reviewed glucose goals (fasting and 2 hour post prandial).  Discussed potential complications for patient and her baby if DM is not well controlled during pregnancy. Reviewed signs and symptoms of hyperglycemia and hypoglycemia along with treatment for both. Discussed diet, exercise, and medication therapy to improve GDM control. Asked patient to check her glucose 5 times per day (fasting, 2 hour post prandial, and bedtime) and to keep a log book of glucose readings and insulin taken. Explained how the doctor she follows up with can use the log book to continue to make insulin adjustments if needed. Discussed increased risk of patient developing DM2 in the future  and encouraged patient to try to get down to ideal body weight and continue following lifestyle modifications after delivery. Patient verbalized understanding of information and she appears to be eager to learn.   Thank you, JuNani GasserHanks, RN, MSN, CDE  Diabetes Coordinator Inpatient Glycemic Control Team Team Pager #[phone]-5pm) 09/03/2021 9:34 AM

## 2021-09-03 NOTE — Progress Notes (Signed)
Entered by error

## 2021-09-03 NOTE — Progress Notes (Signed)
ANTEPARTUM PROGRESS NOTE  Samantha Dominguez is a 41 y.o. [phone] at [redacted]w[redacted]d who is admitted for NRNST,CHTN, and Preeclampsia with severe features.   Estimated Date of Delivery: 10/27/21  Length of Stay:  0 Days. Admitted 09/02/2021  Subjective: Resting in bed comfortably, had a HA this am, treated with 650 mg  tylenol and HA is now resolved. Denies visual changes, epigastric pain  She reports:  -active fetal movement -no leakage of fluid -no vaginal bleeding -no contractions  Vitals:  BP (!) 143/79    Pulse 100    Temp 97.9 F (36.6 C) (Oral)    Resp 16    Ht 5\' 4"  (1.626 m)    Wt 103.4 kg    LMP 01/11/2021 (Exact Date)    SpO2 97%    BMI 39.14 kg/m  Physical Examination: General:   alert, cooperative, and appears stated age  Skin:  normal  Neurologic:    Alert & oriented x 3  Lungs:   clear to auscultation bilaterally  Heart:   regular rate and rhythm, S1, S2 normal, no murmur, click, rub or gallop  Abdomen:  soft, non-tender; bowel sounds normal; no masses,  no organomegaly  Pelvis:  Exam deferred.                               Extremities: : non-tender, symmetric, no edema bilaterally.  DTRs: +2    Fetal monitoring: FHR: 135 bpm, Variability: moderate, Accelerations: Present, Decelerations: Absent  Uterine activity: no contractions per hour  Results for orders placed or performed during the hospital encounter of 09/02/21 (from the past 48 hour(s))  CBC     Status: Abnormal   Collection Time: 09/02/21  3:07 PM  Result Value Ref Range   WBC 7.7 4.0 - 10.5 K/uL   RBC 4.68 3.87 - 5.11 MIL/uL   Hemoglobin 12.6 12.0 - 15.0 g/dL   HCT 37.0 36.0 - 46.0 %   MCV 79.1 (L) 80.0 - 100.0 fL   MCH 26.9 26.0 - 34.0 pg   MCHC 34.1 30.0 - 36.0 g/dL   RDW 14.6 11.5 - 15.5 %   Platelets 258 150 - 400 K/uL   nRBC 0.0 0.0 - 0.2 %    Comment: Performed at [facility], [address] [place], [place] [address]  Comprehensive metabolic panel     Status: Abnormal   Collection  Time: 09/02/21  3:07 PM  Result Value Ref Range   Sodium 134 (L) 135 - 145 mmol/L   Potassium 3.9 3.5 - 5.1 mmol/L   Chloride 105 98 - 111 mmol/L   CO2 20 (L) 22 - 32 mmol/L   Glucose, Bld 86 70 - 99 mg/dL    Comment: Glucose reference range applies only to samples taken after fasting for at least 8 hours.   BUN 7 6 - 20 mg/dL   Creatinine, Ser 0.53 0.44 - 1.00 mg/dL   Calcium 8.8 (L) 8.9 - 10.3 mg/dL   Total Protein 7.6 6.5 - 8.1 g/dL   Albumin 3.2 (L) 3.5 - 5.0 g/dL   AST 32 15 - 41 U/L   ALT 46 (H) 0 - 44 U/L   Alkaline Phosphatase 145 (H) 38 - 126 U/L   Total Bilirubin 0.7 0.3 - 1.2 mg/dL   GFR, Estimated >60 >60 mL/min    Comment: (NOTE) Calculated using the CKD-EPI Creatinine Equation (2021)    Anion gap 9 5 - 15  Comment: Performed at [facility], [address] [place], [place] [address]  Glucose, capillary     Status: None   Collection Time: 09/02/21  3:07 PM  Result Value Ref Range   Glucose-Capillary 85 70 - 99 mg/dL    Comment: Glucose reference range applies only to samples taken after fasting for at least 8 hours.  Protein / creatinine ratio, urine     Status: Abnormal   Collection Time: 09/02/21  3:14 PM  Result Value Ref Range   Creatinine, Urine 25 mg/dL   Total Protein, Urine 24 mg/dL    Comment: NO NORMAL RANGE ESTABLISHED FOR THIS TEST   Protein Creatinine Ratio 0.96 (H) 0.00 - 0.15 mg/mg[Cre]    Comment: Performed at [facility], [address], [place], [place] [address]  Resp Panel by RT-PCR (Flu A&B, Covid) Nasopharyngeal Swab     Status: Abnormal   Collection Time: 09/02/21  5:06 PM   Specimen: Nasopharyngeal Swab; Nasopharyngeal(NP) swabs in vial transport medium  Result Value Ref Range   SARS Coronavirus 2 by RT PCR POSITIVE (A) NEGATIVE    Comment: (NOTE) SARS-CoV-2 target nucleic acids are DETECTED.  The SARS-CoV-2 RNA is generally detectable in upper respiratory specimens during the acute phase of infection. Positive  results are indicative of the presence of the identified virus, but do not rule out bacterial infection or co-infection with other pathogens not detected by the test. Clinical correlation with patient history and other diagnostic information is necessary to determine patient infection status. The expected result is Negative.  Fact Sheet for Patients: EntrepreneurPulse.com.au  Fact Sheet for Healthcare Providers: IncredibleEmployment.be  This test is not yet approved or cleared by the [place] FDA and  has been authorized for detection and/or diagnosis of SARS-CoV-2 by FDA under an Emergency Use Authorization (EUA).  This EUA will remain in effect (meaning this test can be used) for the duration of  the COVID-19 declaration under Section 564(b)(1) of the A ct, 21 U.S.C. section 360bbb-3(b)(1), unless the authorization is terminated or revoked sooner.     Influenza A by PCR NEGATIVE NEGATIVE   Influenza B by PCR NEGATIVE NEGATIVE    Comment: (NOTE) The Xpert Xpress SARS-CoV-2/FLU/RSV plus assay is intended as an aid in the diagnosis of influenza from Nasopharyngeal swab specimens and should not be used as a sole basis for treatment. Nasal washings and aspirates are unacceptable for Xpert Xpress SARS-CoV-2/FLU/RSV testing.  Fact Sheet for Patients: EntrepreneurPulse.com.au  Fact Sheet for Healthcare Providers: IncredibleEmployment.be  This test is not yet approved or cleared by the [place] FDA and has been authorized for detection and/or diagnosis of SARS-CoV-2 by FDA under an Emergency Use Authorization (EUA). This EUA will remain in effect (meaning this test can be used) for the duration of the COVID-19 declaration under Section 564(b)(1) of the Act, 21 U.S.C. section 360bbb-3(b)(1), unless the authorization is terminated or revoked.  Performed at [facility], [address]  [place], [place] [address]   Glucose, capillary     Status: Abnormal   Collection Time: 09/02/21  9:35 PM  Result Value Ref Range   Glucose-Capillary 163 (H) 70 - 99 mg/dL    Comment: Glucose reference range applies only to samples taken after fasting for at least 8 hours.  CBC with Differential     Status: Abnormal   Collection Time: 09/03/21  6:20 AM  Result Value Ref Range   WBC 10.4 4.0 - 10.5 K/uL   RBC 4.63 3.87 -  5.11 MIL/uL   Hemoglobin 12.4 12.0 - 15.0 g/dL   HCT 36.5 36.0 - 46.0 %   MCV 78.8 (L) 80.0 - 100.0 fL   MCH 26.8 26.0 - 34.0 pg   MCHC 34.0 30.0 - 36.0 g/dL   RDW 14.6 11.5 - 15.5 %   Platelets 261 150 - 400 K/uL   nRBC 0.0 0.0 - 0.2 %   Neutrophils Relative % 85 %   Neutro Abs 8.8 (H) 1.7 - 7.7 K/uL   Lymphocytes Relative 12 %   Lymphs Abs 1.2 0.7 - 4.0 K/uL   Monocytes Relative 2 %   Monocytes Absolute 0.2 0.1 - 1.0 K/uL   Eosinophils Relative 0 %   Eosinophils Absolute 0.0 0.0 - 0.5 K/uL   Basophils Relative 0 %   Basophils Absolute 0.0 0.0 - 0.1 K/uL   Immature Granulocytes 1 %   Abs Immature Granulocytes 0.06 0.00 - 0.07 K/uL    Comment: Performed at [facility], [address] [place], [place] [address]  Comprehensive metabolic panel     Status: Abnormal   Collection Time: 09/03/21  6:20 AM  Result Value Ref Range   Sodium 133 (L) 135 - 145 mmol/L   Potassium 3.9 3.5 - 5.1 mmol/L   Chloride 103 98 - 111 mmol/L   CO2 20 (L) 22 - 32 mmol/L   Glucose, Bld 135 (H) 70 - 99 mg/dL    Comment: Glucose reference range applies only to samples taken after fasting for at least 8 hours.   BUN 8 6 - 20 mg/dL   Creatinine, Ser 0.47 0.44 - 1.00 mg/dL   Calcium 7.8 (L) 8.9 - 10.3 mg/dL   Total Protein 7.4 6.5 - 8.1 g/dL   Albumin 3.2 (L) 3.5 - 5.0 g/dL   AST 40 15 - 41 U/L   ALT 58 (H) 0 - 44 U/L   Alkaline Phosphatase 143 (H) 38 - 126 U/L   Total Bilirubin 0.7 0.3 - 1.2 mg/dL   GFR, Estimated >60 >60 mL/min    Comment: (NOTE) Calculated using the  CKD-EPI Creatinine Equation (2021)    Anion gap 10 5 - 15    Comment: Performed at [facility], [address], [place], [place] [address]    No results found.  Current scheduled medications  betamethasone acetate-betamethasone sodium phosphate  12 mg Intramuscular Q24H   metFORMIN  500 mg Oral BID WC   nirmatrelvir/ritonavir EUA  3 tablet Oral BID    I have reviewed the patient's current medications.  ASSESSMENT: Patient Active Problem List   Diagnosis Date Noted   Indication for care in labor or delivery 09/03/2021   Chronic hypertension affecting pregnancy 09/02/2021   COVID-19 affecting pregnancy in third trimester 09/02/2021   GDM (gestational diabetes mellitus) 09/02/2021   Chronic hypertension with superimposed pre-eclampsia 09/02/2021   Rh negative status during pregnancy 04/16/2021   Maternal varicella, non-immune 04/16/2021   Abnormal glucose affecting pregnancy 04/09/21: 3 hour GTT=equivocal 04/16/2021   Obesity affecting pregnancy BMI=39.4 04/09/2021   Advanced maternal age in multigravida 41 yo 04/09/2021   Chronic hypertension during pregnancy, antepartum 04/09/2021   Supervision of high risk pregnancy, antepartum 04/09/2021   GERD (gastroesophageal reflux disease) 04/09/2021   Low-level of literacy (completed first grade) 04/09/2021   Ptyalism 04/09/2021    PLAN: -Continue routine antenatal care. - Will continue Mag Sulfate x24-48hrs - Continue with plan of BMX x2 doses q24hrs - Will treat elevated BPs with conditional Labetalol order - Continue  to test CBGs QID -Metformin 500 mg BID initiated  -Paxlovid started this [id number]    Avelino Leeds CNM Certified Nurse Midwife [facility]

## 2021-09-03 NOTE — TOC Progression Note (Addendum)
Transition of Care [facility]) - Progression Note    Patient Details  Name: BREZLYN MANRIQUE MRN: [id number] Date of Birth: 03-11-1981  Transition of Care [facility]) CM/SW Contact  Anselm Pancoast, RN Phone Number: 09/03/2021, 3:05 PM  Clinical Narrative:    Possible concerns related to diabetes and possible insulin. Diabetic Coordinator met with patient and reviewed diabetic care. Patient will discharge home with Metformin which is covered under Medicaid Plan and readily available at pharmacy of her choice.   Jason @ [facility] unable to accept  due to unable to accept patients over 18 weeks pregnancy.  Georgina Snell @ [facility]  unable to accept pregnant patient.  Meg @ [facility]-unable to accept pregnant patient.  Cheryl @ [facility] does not take Medicaid [facility] with no availability for 2 weeks.  [facility] with no availability for home health.  Mali @ [facility]-no response from outreach on availability.   Outreach to [place] with [facility] for possible home health-awaiting reply          Expected Discharge Plan and Services                                                 Social Determinants of Health (SDOH) Interventions    Readmission Risk Interventions No flowsheet data found.

## 2021-09-03 NOTE — Discharge Summary (Signed)
Patient ID: Samantha CritchleyMarisol D Dominguez MRN: [id number] DOB/AGE: [birth year] 41 y.o.  Admit date: 09/02/2021 Discharge date: 09/03/2021  Admission Diagnoses: Non- Reactive NST, Pre-Eclampsia  Discharge Diagnoses: Pre-eclampsia without severe features  Factors complicating pregnancy: 1. CHTN 2. GDM 3. Obesity 4. Rh neg 5. Varicella non-immune 6. Low literacy  Prenatal Procedures: NST and Preeclampsia  Consults:  Maternal Fetal Medicine, TOC, Diabetic coordinator  Significant Diagnostic Studies:  Results for orders placed or performed during the hospital encounter of 09/02/21 (from the past 168 hour(s))  CBC   Collection Time: 09/02/21  3:07 PM  Result Value Ref Range   WBC 7.7 4.0 - 10.5 K/uL   RBC 4.68 3.87 - 5.11 MIL/uL   Hemoglobin 12.6 12.0 - 15.0 g/dL   HCT 40.937.0 81.136.0 - 91.446.0 %   MCV 79.1 (L) 80.0 - 100.0 fL   MCH 26.9 26.0 - 34.0 pg   MCHC 34.1 30.0 - 36.0 g/dL   RDW 78.214.6 95.611.5 - 21.315.5 %   Platelets 258 150 - 400 K/uL   nRBC 0.0 0.0 - 0.2 %  Comprehensive metabolic panel   Collection Time: 09/02/21  3:07 PM  Result Value Ref Range   Sodium 134 (L) 135 - 145 mmol/L   Potassium 3.9 3.5 - 5.1 mmol/L   Chloride 105 98 - 111 mmol/L   CO2 20 (L) 22 - 32 mmol/L   Glucose, Bld 86 70 - 99 mg/dL   BUN 7 6 - 20 mg/dL   Creatinine, Ser 0.860.53 0.44 - 1.00 mg/dL   Calcium 8.8 (L) 8.9 - 10.3 mg/dL   Total Protein 7.6 6.5 - 8.1 g/dL   Albumin 3.2 (L) 3.5 - 5.0 g/dL   AST 32 15 - 41 U/L   ALT 46 (H) 0 - 44 U/L   Alkaline Phosphatase 145 (H) 38 - 126 U/L   Total Bilirubin 0.7 0.3 - 1.2 mg/dL   GFR, Estimated >57>60 >84>60 mL/min   Anion gap 9 5 - 15  Glucose, capillary   Collection Time: 09/02/21  3:07 PM  Result Value Ref Range   Glucose-Capillary 85 70 - 99 mg/dL  Protein / creatinine ratio, urine   Collection Time: 09/02/21  3:14 PM  Result Value Ref Range   Creatinine, Urine 25 mg/dL   Total Protein, Urine 24 mg/dL   Protein Creatinine Ratio 0.96 (H) 0.00 - 0.15 mg/mg[Cre]  Resp  Panel by RT-PCR (Flu A&B, Covid) Nasopharyngeal Swab   Collection Time: 09/02/21  5:06 PM   Specimen: Nasopharyngeal Swab; Nasopharyngeal(NP) swabs in vial transport medium  Result Value Ref Range   SARS Coronavirus 2 by RT PCR POSITIVE (A) NEGATIVE   Influenza A by PCR NEGATIVE NEGATIVE   Influenza B by PCR NEGATIVE NEGATIVE  Glucose, capillary   Collection Time: 09/02/21  9:35 PM  Result Value Ref Range   Glucose-Capillary 163 (H) 70 - 99 mg/dL  CBC with Differential   Collection Time: 09/03/21  6:20 AM  Result Value Ref Range   WBC 10.4 4.0 - 10.5 K/uL   RBC 4.63 3.87 - 5.11 MIL/uL   Hemoglobin 12.4 12.0 - 15.0 g/dL   HCT 69.636.5 29.536.0 - 28.446.0 %   MCV 78.8 (L) 80.0 - 100.0 fL   MCH 26.8 26.0 - 34.0 pg   MCHC 34.0 30.0 - 36.0 g/dL   RDW 13.214.6 44.011.5 - 10.215.5 %   Platelets 261 150 - 400 K/uL   nRBC 0.0 0.0 - 0.2 %   Neutrophils Relative % 85 %  Neutro Abs 8.8 (H) 1.7 - 7.7 K/uL   Lymphocytes Relative 12 %   Lymphs Abs 1.2 0.7 - 4.0 K/uL   Monocytes Relative 2 %   Monocytes Absolute 0.2 0.1 - 1.0 K/uL   Eosinophils Relative 0 %   Eosinophils Absolute 0.0 0.0 - 0.5 K/uL   Basophils Relative 0 %   Basophils Absolute 0.0 0.0 - 0.1 K/uL   Immature Granulocytes 1 %   Abs Immature Granulocytes 0.06 0.00 - 0.07 K/uL  Comprehensive metabolic panel   Collection Time: 09/03/21  6:20 AM  Result Value Ref Range   Sodium 133 (L) 135 - 145 mmol/L   Potassium 3.9 3.5 - 5.1 mmol/L   Chloride 103 98 - 111 mmol/L   CO2 20 (L) 22 - 32 mmol/L   Glucose, Bld 135 (H) 70 - 99 mg/dL   BUN 8 6 - 20 mg/dL   Creatinine, Ser 3.88 0.44 - 1.00 mg/dL   Calcium 7.8 (L) 8.9 - 10.3 mg/dL   Total Protein 7.4 6.5 - 8.1 g/dL   Albumin 3.2 (L) 3.5 - 5.0 g/dL   AST 40 15 - 41 U/L   ALT 58 (H) 0 - 44 U/L   Alkaline Phosphatase 143 (H) 38 - 126 U/L   Total Bilirubin 0.7 0.3 - 1.2 mg/dL   GFR, Estimated >82 >80 mL/min   Anion gap 10 5 - 15  Glucose, capillary   Collection Time: 09/03/21  9:32 AM  Result Value  Ref Range   Glucose-Capillary 143 (H) 70 - 99 mg/dL  Glucose, capillary   Collection Time: 09/03/21 11:29 AM  Result Value Ref Range   Glucose-Capillary 203 (H) 70 - 99 mg/dL  Glucose, capillary   Collection Time: 09/03/21  3:55 PM  Result Value Ref Range   Glucose-Capillary 169 (H) 70 - 99 mg/dL    Treatments: IV hydration, steroids: Betamethasone, and IV Magnesium  Hospital Course:  This is a 41 y.o. [id number] with IUP at [redacted]w[redacted]d admitted for Non-Reactive NST and Pre-Eclampsia, No leaking of fluid and no bleeding.  She was initially started on magnesium sulfate for seizure precaution and also received betamethasone x 2 doses.  She was transitioned back to Procardia this afternoon, will divide dose for am and pm per patient request, and to decrease undesired side effects.TOC, Diabetic coordinator, and MFM consulted during her stay.  She was observed, fetal heart rate monitoring remained reassuring, and she had no signs/symptoms of progressing preterm labor or other maternal-fetal concerns. Her blood pressures remained stable ranging from 130's-143/70-80's. She has not needed treatment with conditional IV Labetalol during her stay. She was deemed stable for discharge to home with outpatient follow up.  Discharge Physical Exam:  BP 129/66    Pulse (!) 109    Temp 98.1 F (36.7 C) (Oral)    Resp 16    Ht 5\' 4"  (1.626 m)    Wt 103.4 kg    LMP 01/11/2021 (Exact Date)    SpO2 98%    BMI 39.14 kg/m   General: NAD CV: RRR Pulm: CTABL, nl effort ABD: s/nd/nt, gravid DVT Evaluation: LE non-ttp, no evidence of DVT on exam.  NST: FHR baseline: 135 bpm Variability: moderate Accelerations: yes Decelerations: none Time: 20 minutes Category/reactivity: reactive  TOCO: quiet SVE: deferred   -Teaching provided for medication administration(via IOW) -Patient verbalized how to take medications accurately, stating how many to take and when to take them. -Patient understands the warning signs of  worsening Pre-E and when to  notify a provider or report to the hospital. -Patient aware that our social service coordinator will make a home visit on Monday 09/06/21 for further education and resources. -All questions addressed and answered to patient's understanding -Patient to F/U for routine [facility], twice weekly NST/AFI and labs   - Dr Dalbert Garnet aware of hospital course, consults, recommendations and f/u POC   Discharge Condition: Stable  Disposition: Discharge disposition: 01-Home or Self Care        Allergies as of 09/03/2021   No Known Allergies      Medication List     TAKE these medications    aspirin EC 81 MG tablet Take 81 mg by mouth daily. Swallow whole.   glyBURIDE 2.5 MG tablet Commonly known as: DIABETA Take 1 tablet (2.5 mg total) by mouth at bedtime.   metFORMIN 500 MG 24 hr tablet Commonly known as: GLUCOPHAGE-XR Take 1 tablet (500 mg total) by mouth 2 (two) times daily with a meal.   NIFEdipine 30 MG 24 hr tablet Commonly known as: ADALAT CC Take 1 tablet (30 mg total) by mouth 2 (two) times daily.   prenatal vitamin w/FE, FA 27-1 MG Tabs tablet Take 1 tablet by mouth daily at 12 noon.         Signed:  Chari Manning, CNM 09/03/2021 6:45 PM

## 2021-09-03 NOTE — Progress Notes (Addendum)
ANTEPARTUM PROGRESS NOTE  RAFAEL MCDILL is a 41 y.o. [phone] at [redacted]w[redacted]d who is admitted for  Non reactive NST and Pre-eclampsia.   Estimated Date of Delivery: 10/27/21  Length of Stay:  0 Days. Admitted 09/02/2021  Subjective: Denies any HA, visual changes, or epigastric pain  She reports:  -active fetal movement -no leakage of fluid -no vaginal bleeding -no contractions  Vitals:  BP 129/66    Pulse (!) 109    Temp 98.1 F (36.7 C) (Oral)    Resp 16    Ht 5\' 4"  (1.626 m)    Wt 103.4 kg    LMP 01/11/2021 (Exact Date)    SpO2 98%    BMI 39.14 kg/m  Physical Examination: General:   alert, cooperative, and appears stated age  Skin:  normal  Neurologic:    Alert & oriented x 3  Lungs:   clear to auscultation bilaterally  Heart:   regular rate and rhythm, S1, S2 normal, no murmur, click, rub or gallop  Abdomen:  soft, non-tender; bowel sounds normal; no masses,  no organomegaly  Pelvis:  Exam deferred.                         Extremities: : non-tender, symmetric, no edema bilaterally.  DTRs: +2    Fetal monitoring: FHR: 135 bpm, Variability: moderate, Accelerations: Present, Decelerations: Absent, Presentation: Vtx Uterine activity: no contractions per hour  Results for orders placed or performed during the hospital encounter of 09/02/21 (from the past 48 hour(s))  CBC     Status: Abnormal   Collection Time: 09/02/21  3:07 PM  Result Value Ref Range   WBC 7.7 4.0 - 10.5 K/uL   RBC 4.68 3.87 - 5.11 MIL/uL   Hemoglobin 12.6 12.0 - 15.0 g/dL   HCT 37.0 36.0 - 46.0 %   MCV 79.1 (L) 80.0 - 100.0 fL   MCH 26.9 26.0 - 34.0 pg   MCHC 34.1 30.0 - 36.0 g/dL   RDW 14.6 11.5 - 15.5 %   Platelets 258 150 - 400 K/uL   nRBC 0.0 0.0 - 0.2 %    Comment: Performed at [facility], [address] [place], [place] [address]  Comprehensive metabolic panel     Status: Abnormal   Collection Time: 09/02/21  3:07 PM  Result Value Ref Range   Sodium 134 (L) 135 - 145 mmol/L    Potassium 3.9 3.5 - 5.1 mmol/L   Chloride 105 98 - 111 mmol/L   CO2 20 (L) 22 - 32 mmol/L   Glucose, Bld 86 70 - 99 mg/dL    Comment: Glucose reference range applies only to samples taken after fasting for at least 8 hours.   BUN 7 6 - 20 mg/dL   Creatinine, Ser 0.53 0.44 - 1.00 mg/dL   Calcium 8.8 (L) 8.9 - 10.3 mg/dL   Total Protein 7.6 6.5 - 8.1 g/dL   Albumin 3.2 (L) 3.5 - 5.0 g/dL   AST 32 15 - 41 U/L   ALT 46 (H) 0 - 44 U/L   Alkaline Phosphatase 145 (H) 38 - 126 U/L   Total Bilirubin 0.7 0.3 - 1.2 mg/dL   GFR, Estimated >60 >60 mL/min    Comment: (NOTE) Calculated using the CKD-EPI Creatinine Equation (2021)    Anion gap 9 5 - 15    Comment: Performed at [facility], [address] [place], [place] [address]  Glucose, capillary  Status: None   Collection Time: 09/02/21  3:07 PM  Result Value Ref Range   Glucose-Capillary 85 70 - 99 mg/dL    Comment: Glucose reference range applies only to samples taken after fasting for at least 8 hours.  Protein / creatinine ratio, urine     Status: Abnormal   Collection Time: 09/02/21  3:14 PM  Result Value Ref Range   Creatinine, Urine 25 mg/dL   Total Protein, Urine 24 mg/dL    Comment: NO NORMAL RANGE ESTABLISHED FOR THIS TEST   Protein Creatinine Ratio 0.96 (H) 0.00 - 0.15 mg/mg[Cre]    Comment: Performed at [facility], [address] [place], [place] [address]  Resp Panel by RT-PCR (Flu A&B, Covid) Nasopharyngeal Swab     Status: Abnormal   Collection Time: 09/02/21  5:06 PM   Specimen: Nasopharyngeal Swab; Nasopharyngeal(NP) swabs in vial transport medium  Result Value Ref Range   SARS Coronavirus 2 by RT PCR POSITIVE (A) NEGATIVE    Comment: (NOTE) SARS-CoV-2 target nucleic acids are DETECTED.  The SARS-CoV-2 RNA is generally detectable in upper respiratory specimens during the acute phase of infection. Positive results are indicative of the presence of the identified virus, but do not rule out  bacterial infection or co-infection with other pathogens not detected by the test. Clinical correlation with patient history and other diagnostic information is necessary to determine patient infection status. The expected result is Negative.  Fact Sheet for Patients: EntrepreneurPulse.com.au  Fact Sheet for Healthcare Providers: IncredibleEmployment.be  This test is not yet approved or cleared by the [place] FDA and  has been authorized for detection and/or diagnosis of SARS-CoV-2 by FDA under an Emergency Use Authorization (EUA).  This EUA will remain in effect (meaning this test can be used) for the duration of  the COVID-19 declaration under Section 564(b)(1) of the A ct, 21 U.S.C. section 360bbb-3(b)(1), unless the authorization is terminated or revoked sooner.     Influenza A by PCR NEGATIVE NEGATIVE   Influenza B by PCR NEGATIVE NEGATIVE    Comment: (NOTE) The Xpert Xpress SARS-CoV-2/FLU/RSV plus assay is intended as an aid in the diagnosis of influenza from Nasopharyngeal swab specimens and should not be used as a sole basis for treatment. Nasal washings and aspirates are unacceptable for Xpert Xpress SARS-CoV-2/FLU/RSV testing.  Fact Sheet for Patients: EntrepreneurPulse.com.au  Fact Sheet for Healthcare Providers: IncredibleEmployment.be  This test is not yet approved or cleared by the [place] FDA and has been authorized for detection and/or diagnosis of SARS-CoV-2 by FDA under an Emergency Use Authorization (EUA). This EUA will remain in effect (meaning this test can be used) for the duration of the COVID-19 declaration under Section 564(b)(1) of the Act, 21 U.S.C. section 360bbb-3(b)(1), unless the authorization is terminated or revoked.  Performed at [facility], [address] [place],  [address]   Glucose, capillary     Status: Abnormal   Collection Time:  09/02/21  9:35 PM  Result Value Ref Range   Glucose-Capillary 163 (H) 70 - 99 mg/dL    Comment: Glucose reference range applies only to samples taken after fasting for at least 8 hours.  CBC with Differential     Status: Abnormal   Collection Time: 09/03/21  6:20 AM  Result Value Ref Range   WBC 10.4 4.0 - 10.5 K/uL   RBC 4.63 3.87 - 5.11 MIL/uL   Hemoglobin 12.4 12.0 - 15.0 g/dL   HCT 36.5 36.0 - 46.0 %  MCV 78.8 (L) 80.0 - 100.0 fL   MCH 26.8 26.0 - 34.0 pg   MCHC 34.0 30.0 - 36.0 g/dL   RDW 14.6 11.5 - 15.5 %   Platelets 261 150 - 400 K/uL   nRBC 0.0 0.0 - 0.2 %   Neutrophils Relative % 85 %   Neutro Abs 8.8 (H) 1.7 - 7.7 K/uL   Lymphocytes Relative 12 %   Lymphs Abs 1.2 0.7 - 4.0 K/uL   Monocytes Relative 2 %   Monocytes Absolute 0.2 0.1 - 1.0 K/uL   Eosinophils Relative 0 %   Eosinophils Absolute 0.0 0.0 - 0.5 K/uL   Basophils Relative 0 %   Basophils Absolute 0.0 0.0 - 0.1 K/uL   Immature Granulocytes 1 %   Abs Immature Granulocytes 0.06 0.00 - 0.07 K/uL    Comment: Performed at [facility], [address] [place], [place] [address]  Comprehensive metabolic panel     Status: Abnormal   Collection Time: 09/03/21  6:20 AM  Result Value Ref Range   Sodium 133 (L) 135 - 145 mmol/L   Potassium 3.9 3.5 - 5.1 mmol/L   Chloride 103 98 - 111 mmol/L   CO2 20 (L) 22 - 32 mmol/L   Glucose, Bld 135 (H) 70 - 99 mg/dL    Comment: Glucose reference range applies only to samples taken after fasting for at least 8 hours.   BUN 8 6 - 20 mg/dL   Creatinine, Ser 0.47 0.44 - 1.00 mg/dL   Calcium 7.8 (L) 8.9 - 10.3 mg/dL   Total Protein 7.4 6.5 - 8.1 g/dL   Albumin 3.2 (L) 3.5 - 5.0 g/dL   AST 40 15 - 41 U/L   ALT 58 (H) 0 - 44 U/L   Alkaline Phosphatase 143 (H) 38 - 126 U/L   Total Bilirubin 0.7 0.3 - 1.2 mg/dL   GFR, Estimated >60 >60 mL/min    Comment: (NOTE) Calculated using the CKD-EPI Creatinine Equation (2021)    Anion gap 10 5 - 15    Comment: Performed at  [facility], [address] [place], [place] [address]  Glucose, capillary     Status: Abnormal   Collection Time: 09/03/21  9:32 AM  Result Value Ref Range   Glucose-Capillary 143 (H) 70 - 99 mg/dL    Comment: Glucose reference range applies only to samples taken after fasting for at least 8 hours.  Glucose, capillary     Status: Abnormal   Collection Time: 09/03/21 11:29 AM  Result Value Ref Range   Glucose-Capillary 203 (H) 70 - 99 mg/dL    Comment: Glucose reference range applies only to samples taken after fasting for at least 8 hours.  Glucose, capillary     Status: Abnormal   Collection Time: 09/03/21  3:55 PM  Result Value Ref Range   Glucose-Capillary 169 (H) 70 - 99 mg/dL    Comment: Glucose reference range applies only to samples taken after fasting for at least 8 hours.    No results found.  Current scheduled medications  betamethasone acetate-betamethasone sodium phosphate  12 mg Intramuscular Q24H   insulin aspart  0-14 Units Subcutaneous TID PC   insulin NPH Human  0-14 Units Subcutaneous TID PC   metFORMIN  500 mg Oral BID WC   NIFEdipine  30 mg Oral BID   nirmatrelvir/ritonavir EUA  3 tablet Oral BID    I have reviewed the patient's current medications.  ASSESSMENT: Patient Active Problem List  Diagnosis Date Noted   Indication for care in labor or delivery 09/03/2021   Chronic hypertension affecting pregnancy 09/02/2021   COVID-19 affecting pregnancy in third trimester 09/02/2021   GDM (gestational diabetes mellitus) 09/02/2021   Chronic hypertension with superimposed pre-eclampsia 09/02/2021   Rh negative status during pregnancy 04/16/2021   Maternal varicella, non-immune 04/16/2021   Abnormal glucose affecting pregnancy 04/09/21: 3 hour GTT=equivocal 04/16/2021   Obesity affecting pregnancy BMI=39.4 04/09/2021   Advanced maternal age in multigravida 41 yo 04/09/2021   Chronic hypertension during pregnancy, antepartum 04/09/2021    Supervision of high risk pregnancy, antepartum 04/09/2021   GERD (gastroesophageal reflux disease) 04/09/2021   Low-level of literacy (completed first grade) 04/09/2021   Ptyalism 04/09/2021    PLAN: Continue routine antenatal care. -Discontinue Magnesium-per MFM recommendations -Restart Procardia XL 60 mg  -Continue Metformin 500mg  BID -Continue to monitor BP's -monitor patient for new or worsening symptoms pre-e - 2nd dose of BMZ due at 1730  Dr. Leafy Ro aware and agrees with consults, recommendations and [place] Certified Nurse Midwife [facility]

## 2021-09-10 ENCOUNTER — Encounter: Payer: Self-pay | Admitting: Obstetrics and Gynecology

## 2021-09-10 ENCOUNTER — Inpatient Hospital Stay
Admission: EM | Admit: 2021-09-10 | Discharge: 2021-09-14 | DRG: 784 | Disposition: A | Discharge status: Home / Self Care | Payer: Medicaid Other | Attending: Certified Nurse Midwife | Admitting: Certified Nurse Midwife

## 2021-09-10 ENCOUNTER — Other Ambulatory Visit: Payer: Self-pay

## 2021-09-10 DIAGNOSIS — O114 Pre-existing hypertension with pre-eclampsia, complicating childbirth: Principal | ICD-10-CM | POA: Diagnosis present

## 2021-09-10 DIAGNOSIS — O24425 Gestational diabetes mellitus in childbirth, controlled by oral hypoglycemic drugs: Secondary | ICD-10-CM | POA: Diagnosis present

## 2021-09-10 DIAGNOSIS — Z302 Encounter for sterilization: Secondary | ICD-10-CM

## 2021-09-10 DIAGNOSIS — O10919 Unspecified pre-existing hypertension complicating pregnancy, unspecified trimester: Secondary | ICD-10-CM

## 2021-09-10 DIAGNOSIS — Z8616 Personal history of COVID-19: Secondary | ICD-10-CM | POA: Diagnosis not present

## 2021-09-10 DIAGNOSIS — O1002 Pre-existing essential hypertension complicating childbirth: Secondary | ICD-10-CM | POA: Diagnosis present

## 2021-09-10 DIAGNOSIS — Z7982 Long term (current) use of aspirin: Secondary | ICD-10-CM | POA: Diagnosis not present

## 2021-09-10 DIAGNOSIS — D62 Acute posthemorrhagic anemia: Secondary | ICD-10-CM | POA: Diagnosis not present

## 2021-09-10 DIAGNOSIS — Z3A33 33 weeks gestation of pregnancy: Secondary | ICD-10-CM | POA: Diagnosis not present

## 2021-09-10 DIAGNOSIS — O26893 Other specified pregnancy related conditions, third trimester: Secondary | ICD-10-CM | POA: Diagnosis present

## 2021-09-10 DIAGNOSIS — U071 COVID-19: Secondary | ICD-10-CM

## 2021-09-10 DIAGNOSIS — O99810 Abnormal glucose complicating pregnancy: Secondary | ICD-10-CM

## 2021-09-10 DIAGNOSIS — O119 Pre-existing hypertension with pre-eclampsia, unspecified trimester: Secondary | ICD-10-CM | POA: Diagnosis present

## 2021-09-10 DIAGNOSIS — Z6791 Unspecified blood type, Rh negative: Secondary | ICD-10-CM

## 2021-09-10 DIAGNOSIS — O9081 Anemia of the puerperium: Secondary | ICD-10-CM | POA: Diagnosis not present

## 2021-09-10 DIAGNOSIS — O365930 Maternal care for other known or suspected poor fetal growth, third trimester, not applicable or unspecified: Secondary | ICD-10-CM | POA: Diagnosis present

## 2021-09-10 DIAGNOSIS — O99214 Obesity complicating childbirth: Secondary | ICD-10-CM | POA: Diagnosis present

## 2021-09-10 DIAGNOSIS — Z23 Encounter for immunization: Secondary | ICD-10-CM

## 2021-09-10 DIAGNOSIS — Z2839 Other underimmunization status: Secondary | ICD-10-CM

## 2021-09-10 DIAGNOSIS — O09899 Supervision of other high risk pregnancies, unspecified trimester: Secondary | ICD-10-CM

## 2021-09-10 LAB — CBC
HCT: 38.8 % (ref 36.0–46.0)
Hemoglobin: 13.2 g/dL (ref 12.0–15.0)
MCH: 26.7 pg (ref 26.0–34.0)
MCHC: 34 g/dL (ref 30.0–36.0)
MCV: 78.5 fL — ABNORMAL LOW (ref 80.0–100.0)
Platelets: 300 10*3/uL (ref 150–400)
RBC: 4.94 MIL/uL (ref 3.87–5.11)
RDW: 14.2 % (ref 11.5–15.5)
WBC: 10.4 10*3/uL (ref 4.0–10.5)
nRBC: 0 % (ref 0.0–0.2)

## 2021-09-10 LAB — COMPREHENSIVE METABOLIC PANEL
ALT: 131 U/L — ABNORMAL HIGH (ref 0–44)
AST: 49 U/L — ABNORMAL HIGH (ref 15–41)
Albumin: 3.2 g/dL — ABNORMAL LOW (ref 3.5–5.0)
Alkaline Phosphatase: 186 U/L — ABNORMAL HIGH (ref 38–126)
Anion gap: 10 (ref 5–15)
BUN: 12 mg/dL (ref 6–20)
CO2: 21 mmol/L — ABNORMAL LOW (ref 22–32)
Calcium: 9.1 mg/dL (ref 8.9–10.3)
Chloride: 102 mmol/L (ref 98–111)
Creatinine, Ser: 0.52 mg/dL (ref 0.44–1.00)
GFR, Estimated: >60 mL/min (ref >60)
Glucose, Bld: 82 mg/dL (ref 70–99)
Potassium: 3.9 mmol/L (ref 3.5–5.1)
Sodium: 133 mmol/L — ABNORMAL LOW (ref 135–145)
Total Bilirubin: 0.7 mg/dL (ref 0.3–1.2)
Total Protein: 7.4 g/dL (ref 6.5–8.1)

## 2021-09-10 LAB — GLUCOSE, CAPILLARY
Glucose-Capillary: 124 mg/dL — ABNORMAL HIGH (ref 70–99)
Glucose-Capillary: 132 mg/dL — ABNORMAL HIGH (ref 70–99)
Glucose-Capillary: 78 mg/dL (ref 70–99)
Glucose-Capillary: 88 mg/dL (ref 70–99)

## 2021-09-10 LAB — TYPE AND SCREEN
ABO/RH(D): O NEG
Antibody Screen: POSITIVE

## 2021-09-10 LAB — ABO/RH: ABO/RH(D): O NEG

## 2021-09-10 LAB — PROTEIN / CREATININE RATIO, URINE
Creatinine, Urine: 30 mg/dL
Protein Creatinine Ratio: 0.77 mg/mg{Cre} — ABNORMAL HIGH (ref 0.00–0.15)
Total Protein, Urine: 23 mg/dL

## 2021-09-10 LAB — GROUP B STREP BY PCR: Group B strep by PCR: NEGATIVE

## 2021-09-10 MED ORDER — LABETALOL HCL 5 MG/ML IV SOLN
20.0000 mg | INTRAVENOUS | Status: DC | PRN
  Administered 2021-09-11: 20 mg via INTRAVENOUS
  Filled 2021-09-10: qty 4

## 2021-09-10 MED ORDER — TERBUTALINE SULFATE 1 MG/ML IJ SOLN: 0.2500 mg | Freq: Once | INTRAMUSCULAR | Status: DC | PRN

## 2021-09-10 MED ORDER — ONDANSETRON HCL 4 MG/2ML IJ SOLN: 4.0000 mg | Freq: Four times a day (QID) | INTRAMUSCULAR | Status: DC | PRN

## 2021-09-10 MED ORDER — ACETAMINOPHEN 325 MG PO TABS
650.0000 mg | ORAL_TABLET | ORAL | Status: DC | PRN
  Administered 2021-09-11: 650 mg via ORAL
  Filled 2021-09-10: qty 2

## 2021-09-10 MED ORDER — LABETALOL HCL 5 MG/ML IV SOLN: 40.0000 mg | INTRAVENOUS | Status: DC | PRN

## 2021-09-10 MED ORDER — OXYTOCIN-SODIUM CHLORIDE 30-0.9 UT/500ML-% IV SOLN
1.0000 m[IU]/min | INTRAVENOUS | Status: DC
  Administered 2021-09-10: 2 m[IU]/min via INTRAVENOUS
  Filled 2021-09-10: qty 1000

## 2021-09-10 MED ORDER — CALCIUM GLUCONATE 10 % IV SOLN
INTRAVENOUS | Status: AC
  Filled 2021-09-10: qty 10

## 2021-09-10 MED ORDER — PENICILLIN G POT IN DEXTROSE 60000 UNIT/ML IV SOLN: 3.0000 10*6.[IU] | INTRAVENOUS | Status: DC

## 2021-09-10 MED ORDER — OXYTOCIN BOLUS FROM INFUSION: 333.0000 mL | Freq: Once | INTRAVENOUS | Status: DC

## 2021-09-10 MED ORDER — LIDOCAINE HCL (PF) 1 % IJ SOLN: 30.0000 mL | INTRAMUSCULAR | Status: DC | PRN

## 2021-09-10 MED ORDER — SOD CITRATE-CITRIC ACID 500-334 MG/5ML PO SOLN: 30.0000 mL | ORAL | Status: DC | PRN

## 2021-09-10 MED ORDER — FENTANYL CITRATE (PF) 100 MCG/2ML IJ SOLN: 50.0000 ug | INTRAMUSCULAR | Status: DC | PRN

## 2021-09-10 MED ORDER — LIDOCAINE HCL (PF) 1 % IJ SOLN
INTRAMUSCULAR | Status: AC
  Filled 2021-09-10: qty 30

## 2021-09-10 MED ORDER — OXYTOCIN 10 UNIT/ML IJ SOLN
INTRAMUSCULAR | Status: AC
  Filled 2021-09-10: qty 2

## 2021-09-10 MED ORDER — MAGNESIUM SULFATE 40 GM/1000ML IV SOLN
2.0000 g/h | INTRAVENOUS | Status: DC
  Administered 2021-09-10 – 2021-09-11 (×2): 2 g/h via INTRAVENOUS
  Filled 2021-09-10 (×2): qty 1000

## 2021-09-10 MED ORDER — MISOPROSTOL 200 MCG PO TABS
ORAL_TABLET | ORAL | Status: AC
  Filled 2021-09-10: qty 4

## 2021-09-10 MED ORDER — LACTATED RINGERS IV SOLN: 500.0000 mL | INTRAVENOUS | Status: DC | PRN

## 2021-09-10 MED ORDER — MAGNESIUM SULFATE BOLUS VIA INFUSION
4.0000 g | Freq: Once | INTRAVENOUS | Status: AC
  Administered 2021-09-10: 4 g via INTRAVENOUS
  Filled 2021-09-10: qty 1000

## 2021-09-10 MED ORDER — OXYTOCIN-SODIUM CHLORIDE 30-0.9 UT/500ML-% IV SOLN
2.5000 [IU]/h | INTRAVENOUS | Status: DC
  Administered 2021-09-11 (×2): 10 [IU]/h via INTRAVENOUS
  Filled 2021-09-10: qty 1000

## 2021-09-10 MED ORDER — LABETALOL HCL 5 MG/ML IV SOLN: 80.0000 mg | INTRAVENOUS | Status: DC | PRN

## 2021-09-10 MED ORDER — HYDRALAZINE HCL 20 MG/ML IJ SOLN: 10.0000 mg | INTRAMUSCULAR | Status: DC | PRN

## 2021-09-10 MED ORDER — SODIUM CHLORIDE 0.9 % IV SOLN: 5.0000 10*6.[IU] | Freq: Once | INTRAVENOUS | Status: DC

## 2021-09-10 MED ORDER — LACTATED RINGERS IV SOLN: INTRAVENOUS | Status: DC

## 2021-09-10 MED ORDER — AMMONIA AROMATIC IN INHA
RESPIRATORY_TRACT | Status: AC
  Filled 2021-09-10: qty 10

## 2021-09-10 NOTE — Progress Notes (Signed)
Labor Progress Note  Samantha Dominguez is a 41 y.o. [id number] at [redacted]w[redacted]d by ultrasound admitted for induction of labor due to [facility] with SIPE.  Subjective: denies sx PIH, feeling mild cramping, otherwise well.   Objective: BP (!) 147/92    Pulse (!) 101    Temp 97.8 F (36.6 C) (Oral)    Resp 18    Ht 5\' 4"  (1.626 m)    Wt 103.4 kg    LMP 01/11/2021 (Exact Date)    SpO2 98%    BMI 39.14 kg/m  Notable VS details: reviewed  Vitals:   09/10/21 1744 09/10/21 1801 09/10/21 1846 09/10/21 1909  BP: (!) 135/95 (!) 157/100 (!) 150/99 (!) 157/99   09/10/21 1930 09/10/21 2030 09/10/21 2101  BP: (!) 158/99 (!) 155/101 (!) 147/92     Fetal Assessment: FHT:  FHR: 150 bpm, variability: min-mod,  accelerations:  Present,  decelerations:  Present occasional variable decel Category/reactivity:  Category II UC:   regular, every 3-6 minutes, pitocin now at 50mu/min SVE:   deferred; confirmed cephalic with bedside US.  Membrane status:intact Amniotic color: n/a  Labs: Lab Results  Component Value Date   WBC 10.4 09/10/2021   HGB 13.2 09/10/2021   HCT 38.8 09/10/2021   MCV 78.5 (L) 09/10/2021   PLT 300 09/10/2021    Assessment / Plan: [id number] at [redacted]w[redacted]d with CHTN with SIPE, GDMA2, AMA  Labor:  ripening with low dose pitocin, plan to maintain 36mu/min  Updated Dr Glennon Mac.  Preeclampsia:  on magnesium sulfate, intake and ouput balanced, and labs stable Fetal Wellbeing:  Category II- continue to monitor closely, min-mod variability and occasional variables.  Pain Control:  Labor support without medications I/D:   GBS Neg by [place], CNM 09/10/2021, 9:16 PM

## 2021-09-10 NOTE — H&P (Signed)
OB History & Physical   History of Present Illness:  Chief Complaint: sent from office for induction  HPI:  Samantha Dominguez is a 41 y.o. G45P3003 female at [redacted]w[redacted]d dated by [place] at 41wks.  She presents to L&D for induction of labor due to worsening BP, P/C ratio and LFTs. Nonreactive NST in office today.   Reports active FM, denies UCs, LOF or VB. Denies HA, VD or RUQ pain.     Pregnancy Issues: 1. CHTN- poor compliance, DC home on procardia 30mg  XL BID on 09/03/21 2. GDM- not checking CBG, dc home on Metformin 500mg  BID, Glyburide 2.5mg  daily on 09/03/21 3. Obesity, BMI 38.9 4. Rh neg, rhogam given 5. Varicella non-immune 6. Illiteracy and language barrier. Speaks Spanish, unable to read or write.  7. Superimposed Pre-E: elevated LFTs, rising P/C ratio. S/p BMZ on 1/5 and 1/6 8. COVID Pos on 1/5   Maternal Medical History:   Past Medical History:  Diagnosis Date   Chronic hypertension affecting pregnancy 09/02/2021   GDM (gestational diabetes mellitus) 09/02/2021   GERD (gastroesophageal reflux disease) 04/08/2021   states burning sensation and nausea after eats   Headache 04/08/2021   states occasional headache    Past Surgical History:  Procedure Laterality Date   denies     NO PAST SURGERIES      No Known Allergies  Prior to Admission medications   Medication Sig Start Date End Date Taking? Authorizing Provider  aspirin EC 81 MG tablet Take 81 mg by mouth daily. Swallow whole.    [provider]  glyBURIDE (DIABETA) 2.5 MG tablet Take 1 tablet (2.5 mg total) by mouth at bedtime. 09/03/21 10/03/21  11/01/21, CNM  metFORMIN (GLUCOPHAGE-XR) 500 MG 24 hr tablet Take 1 tablet (500 mg total) by mouth 2 (two) times daily with a meal. 09/03/21 10/03/21  11/01/21, CNM  NIFEdipine (ADALAT CC) 30 MG 24 hr tablet Take 1 tablet (30 mg total) by mouth 2 (two) times daily. 09/03/21 10/03/21  11/01/21, CNM  prenatal vitamin  w/FE, FA (PRENATAL 1 + 1) 27-1 MG TABS tablet Take 1 tablet by mouth daily at 12 noon.    [provider]     Prenatal care site: [facility]  and transfer to [facility] 04/2021  Social History: She  reports that she has never smoked. She has never been exposed to tobacco smoke. She has never used smokeless tobacco. She reports that she does not currently use alcohol. She reports that she does not use drugs.  Family History: family history includes [place] in her brother, brother, brother, daughter, daughter, mother, and son.   Review of Systems: A full review of systems was performed and negative except as noted in the HPI.     Physical Exam:  Vital Signs: BP (!) 135/95 (BP Location: Right Arm)    Pulse (!) 105    Temp 99.3 F (37.4 C) (Oral)    Resp 18    Ht 5\' 4"  (1.626 m)    Wt 103.4 kg    LMP 01/11/2021 (Exact Date)    BMI 39.14 kg/m   General: no acute distress.  HEENT: normocephalic, atraumatic Heart: regular rate & rhythm.  No murmurs/rubs/gallops Lungs: clear to auscultation bilaterally, normal respiratory effort Abdomen: soft, gravid, non-tender;  EFW: last growth 05/2021 performed 09/02/21 at [facility] OB Pelvic:   External: Normal external female genitalia  Cervix: Dilation: 1 / Effacement (%): Thick /  anterior, firm   Extremities: non-tender, symmetric, No edema bilaterally.  DTRs: 2+  Neurologic: Alert & oriented x 3.    Results for orders placed or performed during the hospital encounter of 09/10/21 (from the past 24 hour(s))  Glucose, capillary     Status: None   Collection Time: 09/10/21  5:58 PM  Result Value Ref Range   Glucose-Capillary 88 70 - 99 mg/dL    Growth [place] 0/0/93:  Vertex Fhr=144 bpm Post Rt lat plac Afi=11.58 cm= 28% Efw=1829 g = 49%  Pertinent Results:  Prenatal Labs: Blood type/Rh O neg, rhogam given 08/05/21  Antibody screen neg  Rubella Immune  Varicella NON-Immune  RPR NR  HBsAg Neg  HIV NR  GC neg  Chlamydia neg  Genetic  screening Declined genetic screening  1 hour GTT  144  3 hour GTT 105- [phone]  GBS  pending   FHT: 150bpm, minimal variability, no accels or decels TOCO: none noted.  SVE:  Dilation: 1 / Effacement (%): Thick /      Cephalic by leopolds/confirmed with [place]   Assessment:  Samantha Dominguez is a 41 y.o. G33P3003 female at [redacted]w[redacted]d with SIPE with CHTN.   Plan:  1. Admit to Labor & Delivery; consents reviewed and obtained - reviewed POC with Dr Jean Rosenthal, pt to be admitted for induction of labor due to SIPE with [facility]; s/p consult with MFM who agrees with delivery.  - GBS by PCR now.  - Mag sulfate 4gm load then 2gm/hr - CBG q2hrs, if > 120 x 2, will start Endotool.  - ripening with low dose Pitocin due to nonreactive NST.   2. Fetal Well being  - Fetal Tracing: Cat II- minimal variabliity.  - Group B Streptococcus ppx indicated: pending by PCR - Presentation: cephalic confirmed by exam.    3. Routine OB: - Prenatal labs reviewed, as above - Rh O neg - CBC, T&S, RPR on admit - Clear fluids, IVF  4. Induction of Labor -  Contractions: external toco in place -  Pelvis proven to 3600grams -  Plan for induction with low dose pitocin, consider foley balloon.  -  Plan for continuous fetal monitoring  -  Maternal pain control as desired - Anticipate vaginal delivery  5. Post Partum Planning: - Infant feeding: breast/formula - Contraception: desires BTL - flu vaccine: 06/08/21 - Tdap 08/05/21  Prudencio Pair Quantavius Humm, CNM 09/10/21 6:10 PM

## 2021-09-10 NOTE — Progress Notes (Signed)
Pt is a G4P3003 at [redacted]w[redacted]d that presents for IOL d/t Preeclampsia. Pt denies HA, visual changes, Epigastric pain and reflexes are plus 2 with no clonus or edema noted on exam. Interpreter Eulah Citizen used via Ipad for complete admission and patient understood all admission questions and Induction education. Pt speaks and understands spanish but cannot read or write it. Initial BP 135/95. Orders given to start Pitocin and Mag and patient verbalized understanding of this plan and all questions answered.

## 2021-09-11 ENCOUNTER — Inpatient Hospital Stay: Payer: Medicaid Other | Admitting: Anesthesiology

## 2021-09-11 ENCOUNTER — Encounter
Admission: EM | Disposition: A | Discharge status: Home / Self Care | Payer: Self-pay | Source: Home / Self Care | Attending: Certified Nurse Midwife

## 2021-09-11 ENCOUNTER — Encounter: Payer: Self-pay | Admitting: Obstetrics and Gynecology

## 2021-09-11 LAB — RPR: RPR Ser Ql: NONREACTIVE

## 2021-09-11 LAB — COMPREHENSIVE METABOLIC PANEL
ALT: 130 U/L — ABNORMAL HIGH (ref 0–44)
ALT: 142 U/L — ABNORMAL HIGH (ref 0–44)
AST: 60 U/L — ABNORMAL HIGH (ref 15–41)
AST: 65 U/L — ABNORMAL HIGH (ref 15–41)
Albumin: 3 g/dL — ABNORMAL LOW (ref 3.5–5.0)
Albumin: 3.2 g/dL — ABNORMAL LOW (ref 3.5–5.0)
Alkaline Phosphatase: 160 U/L — ABNORMAL HIGH (ref 38–126)
Alkaline Phosphatase: 175 U/L — ABNORMAL HIGH (ref 38–126)
Anion gap: 9 (ref 5–15)
Anion gap: 10 (ref 5–15)
BUN: 8 mg/dL (ref 6–20)
BUN: 6 mg/dL (ref 6–20)
CO2: 24 mmol/L (ref 22–32)
CO2: 22 mmol/L (ref 22–32)
Calcium: 7.6 mg/dL — ABNORMAL LOW (ref 8.9–10.3)
Calcium: 7.2 mg/dL — ABNORMAL LOW (ref 8.9–10.3)
Chloride: 97 mmol/L — ABNORMAL LOW (ref 98–111)
Chloride: 97 mmol/L — ABNORMAL LOW (ref 98–111)
Creatinine, Ser: 0.59 mg/dL (ref 0.44–1.00)
Creatinine, Ser: 0.56 mg/dL (ref 0.44–1.00)
GFR, Estimated: >60 mL/min (ref >60)
GFR, Estimated: >60 mL/min (ref >60)
Glucose, Bld: 113 mg/dL — ABNORMAL HIGH (ref 70–99)
Glucose, Bld: 106 mg/dL — ABNORMAL HIGH (ref 70–99)
Potassium: 3.9 mmol/L (ref 3.5–5.1)
Potassium: 3.8 mmol/L (ref 3.5–5.1)
Sodium: 130 mmol/L — ABNORMAL LOW (ref 135–145)
Sodium: 129 mmol/L — ABNORMAL LOW (ref 135–145)
Total Bilirubin: 0.4 mg/dL (ref 0.3–1.2)
Total Bilirubin: 0.6 mg/dL (ref 0.3–1.2)
Total Protein: 7 g/dL (ref 6.5–8.1)
Total Protein: 7.4 g/dL (ref 6.5–8.1)

## 2021-09-11 LAB — CBC
HCT: 37.1 % (ref 36.0–46.0)
HCT: 39.4 % (ref 36.0–46.0)
Hemoglobin: 12.6 g/dL (ref 12.0–15.0)
Hemoglobin: 13.3 g/dL (ref 12.0–15.0)
MCH: 26.9 pg (ref 26.0–34.0)
MCH: 26.7 pg (ref 26.0–34.0)
MCHC: 34 g/dL (ref 30.0–36.0)
MCHC: 33.8 g/dL (ref 30.0–36.0)
MCV: 79.3 fL — ABNORMAL LOW (ref 80.0–100.0)
MCV: 79 fL — ABNORMAL LOW (ref 80.0–100.0)
Platelets: 278 10*3/uL (ref 150–400)
Platelets: 256 10*3/uL (ref 150–400)
RBC: 4.68 MIL/uL (ref 3.87–5.11)
RBC: 4.99 MIL/uL (ref 3.87–5.11)
RDW: 14.3 % (ref 11.5–15.5)
RDW: 14.3 % (ref 11.5–15.5)
WBC: 8.7 10*3/uL (ref 4.0–10.5)
WBC: 10.9 10*3/uL — ABNORMAL HIGH (ref 4.0–10.5)
nRBC: 0 % (ref 0.0–0.2)
nRBC: 0 % (ref 0.0–0.2)

## 2021-09-11 LAB — GLUCOSE, CAPILLARY
Glucose-Capillary: 83 mg/dL (ref 70–99)
Glucose-Capillary: 91 mg/dL (ref 70–99)
Glucose-Capillary: 149 mg/dL — ABNORMAL HIGH (ref 70–99)
Glucose-Capillary: 113 mg/dL — ABNORMAL HIGH (ref 70–99)
Glucose-Capillary: 87 mg/dL (ref 70–99)
Glucose-Capillary: 92 mg/dL (ref 70–99)
Glucose-Capillary: 99 mg/dL (ref 70–99)

## 2021-09-11 LAB — MAGNESIUM: Magnesium: 5.2 mg/dL — ABNORMAL HIGH (ref 1.7–2.4)

## 2021-09-11 SURGERY — Surgical Case
Anesthesia: Spinal

## 2021-09-11 MED ORDER — SODIUM CHLORIDE 0.9 % IV SOLN
INTRAVENOUS | Status: AC
  Filled 2021-09-11: qty 5

## 2021-09-11 MED ORDER — TETANUS-DIPHTH-ACELL PERTUSSIS 5-2.5-18.5 LF-MCG/0.5 IM SUSY
0.5000 mL | PREFILLED_SYRINGE | Freq: Once | INTRAMUSCULAR | Status: DC
  Filled 2021-09-11: qty 0.5

## 2021-09-11 MED ORDER — MEPERIDINE HCL 25 MG/ML IJ SOLN: 6.2500 mg | INTRAMUSCULAR | Status: DC | PRN

## 2021-09-11 MED ORDER — CALCIUM CARBONATE ANTACID 500 MG PO CHEW
2.0000 | CHEWABLE_TABLET | Freq: Once | ORAL | Status: AC
  Administered 2021-09-11: 400 mg via ORAL

## 2021-09-11 MED ORDER — KETOROLAC TROMETHAMINE 30 MG/ML IJ SOLN: 30.0000 mg | Freq: Four times a day (QID) | INTRAMUSCULAR | Status: AC | PRN

## 2021-09-11 MED ORDER — SENNOSIDES-DOCUSATE SODIUM 8.6-50 MG PO TABS
2.0000 | ORAL_TABLET | Freq: Every day | ORAL | Status: DC
Start: 2021-09-12 — End: 2021-09-14
  Administered 2021-09-12 – 2021-09-14 (×3): 2 via ORAL
  Filled 2021-09-11 (×4): qty 2

## 2021-09-11 MED ORDER — PROMETHAZINE HCL 25 MG/ML IJ SOLN: 6.2500 mg | INTRAMUSCULAR | Status: DC | PRN

## 2021-09-11 MED ORDER — CALCIUM CARBONATE ANTACID 500 MG PO CHEW
CHEWABLE_TABLET | ORAL | Status: AC
  Filled 2021-09-11: qty 2

## 2021-09-11 MED ORDER — NIFEDIPINE ER OSMOTIC RELEASE 30 MG PO TB24
30.0000 mg | ORAL_TABLET | Freq: Two times a day (BID) | ORAL | Status: DC
  Administered 2021-09-12 – 2021-09-14 (×6): 30 mg via ORAL
  Filled 2021-09-11 (×6): qty 1

## 2021-09-11 MED ORDER — MAGNESIUM SULFATE BOLUS VIA INFUSION
2.0000 g/h | Freq: Once | INTRAVENOUS | Status: DC
  Filled 2021-09-11: qty 1000

## 2021-09-11 MED ORDER — MORPHINE SULFATE (PF) 0.5 MG/ML IJ SOLN
INTRAMUSCULAR | Status: DC | PRN
  Administered 2021-09-11: .1 mg via INTRATHECAL

## 2021-09-11 MED ORDER — BUPIVACAINE HCL (PF) 0.5 % IJ SOLN
INTRAMUSCULAR | Status: DC | PRN
  Administered 2021-09-11: 60 mL

## 2021-09-11 MED ORDER — MAGNESIUM SULFATE 40 GM/1000ML IV SOLN
2.0000 g/h | INTRAVENOUS | Status: DC
  Administered 2021-09-11: 2 g/h via INTRAVENOUS

## 2021-09-11 MED ORDER — ENOXAPARIN SODIUM 40 MG/0.4ML IJ SOSY
40.0000 mg | PREFILLED_SYRINGE | INTRAMUSCULAR | Status: DC
  Administered 2021-09-12 – 2021-09-13 (×2): 40 mg via SUBCUTANEOUS
  Filled 2021-09-11 (×3): qty 0.4

## 2021-09-11 MED ORDER — CARBOPROST TROMETHAMINE 250 MCG/ML IM SOLN
INTRAMUSCULAR | Status: DC | PRN
  Administered 2021-09-11: 250 ug via INTRAMUSCULAR

## 2021-09-11 MED ORDER — DIPHENOXYLATE-ATROPINE 2.5-0.025 MG PO TABS
2.0000 | ORAL_TABLET | Freq: Four times a day (QID) | ORAL | Status: DC | PRN
  Administered 2021-09-11: 2 via ORAL
  Filled 2021-09-11 (×3): qty 2

## 2021-09-11 MED ORDER — DIBUCAINE (PERIANAL) 1 % EX OINT: 1.0000 "application " | TOPICAL_OINTMENT | CUTANEOUS | Status: DC | PRN

## 2021-09-11 MED ORDER — CEFAZOLIN SODIUM-DEXTROSE 2-4 GM/100ML-% IV SOLN
INTRAVENOUS | Status: AC
  Administered 2021-09-11: 2 g via INTRAVENOUS
  Filled 2021-09-11: qty 100

## 2021-09-11 MED ORDER — ONDANSETRON HCL 4 MG/2ML IJ SOLN
INTRAMUSCULAR | Status: AC
  Filled 2021-09-11: qty 2

## 2021-09-11 MED ORDER — BUPIVACAINE IN DEXTROSE 0.75-8.25 % IT SOLN
INTRATHECAL | Status: DC | PRN
  Administered 2021-09-11: 1.6 mL via INTRATHECAL

## 2021-09-11 MED ORDER — FENTANYL CITRATE (PF) 100 MCG/2ML IJ SOLN
INTRAMUSCULAR | Status: DC | PRN
  Administered 2021-09-11: 15 ug via INTRATHECAL

## 2021-09-11 MED ORDER — SODIUM CHLORIDE 0.9 % IR SOLN
Status: DC | PRN
  Administered 2021-09-11: 20 mL

## 2021-09-11 MED ORDER — COCONUT OIL OIL
1.0000 "application " | TOPICAL_OIL | Status: DC | PRN
  Administered 2021-09-12: 1 via TOPICAL
  Filled 2021-09-11 (×2): qty 120

## 2021-09-11 MED ORDER — FENTANYL-BUPIVACAINE-NACL 0.5-0.125-0.9 MG/250ML-% EP SOLN
EPIDURAL | Status: AC
  Filled 2021-09-11: qty 250

## 2021-09-11 MED ORDER — PHENYLEPHRINE HCL-NACL 20-0.9 MG/250ML-% IV SOLN
INTRAVENOUS | Status: AC
  Filled 2021-09-11: qty 250

## 2021-09-11 MED ORDER — DIPHENHYDRAMINE HCL 50 MG/ML IJ SOLN: 12.5000 mg | INTRAMUSCULAR | Status: DC | PRN

## 2021-09-11 MED ORDER — PHENYLEPHRINE 40 MCG/ML (10ML) SYRINGE FOR IV PUSH (FOR BLOOD PRESSURE SUPPORT): 80.0000 ug | PREFILLED_SYRINGE | INTRAVENOUS | Status: DC | PRN

## 2021-09-11 MED ORDER — DIPHENHYDRAMINE HCL 25 MG PO CAPS: 25.0000 mg | ORAL_CAPSULE | ORAL | Status: DC | PRN

## 2021-09-11 MED ORDER — OXYCODONE HCL 5 MG PO TABS
5.0000 mg | ORAL_TABLET | ORAL | Status: DC | PRN
  Administered 2021-09-13: 10 mg via ORAL
  Filled 2021-09-11: qty 2

## 2021-09-11 MED ORDER — ONDANSETRON HCL 4 MG/2ML IJ SOLN
INTRAMUSCULAR | Status: DC | PRN
  Administered 2021-09-11: 4 mg via INTRAVENOUS

## 2021-09-11 MED ORDER — KETOROLAC TROMETHAMINE 15 MG/ML IJ SOLN
INTRAMUSCULAR | Status: DC | PRN
Start: 2021-09-11 — End: 2021-09-11
  Administered 2021-09-11: 30 mg via INTRAVENOUS

## 2021-09-11 MED ORDER — MORPHINE SULFATE (PF) 0.5 MG/ML IJ SOLN
INTRAMUSCULAR | Status: AC
  Filled 2021-09-11: qty 10

## 2021-09-11 MED ORDER — SODIUM CHLORIDE 0.9% FLUSH: 3.0000 mL | INTRAVENOUS | Status: DC | PRN

## 2021-09-11 MED ORDER — WITCH HAZEL-GLYCERIN EX PADS: 1.0000 "application " | MEDICATED_PAD | CUTANEOUS | Status: DC | PRN

## 2021-09-11 MED ORDER — ONDANSETRON HCL 4 MG/2ML IJ SOLN: 4.0000 mg | Freq: Three times a day (TID) | INTRAMUSCULAR | Status: DC | PRN

## 2021-09-11 MED ORDER — EPHEDRINE 5 MG/ML INJ: 10.0000 mg | INTRAVENOUS | Status: DC | PRN

## 2021-09-11 MED ORDER — ZOLPIDEM TARTRATE 5 MG PO TABS: 5.0000 mg | ORAL_TABLET | Freq: Every evening | ORAL | Status: DC | PRN

## 2021-09-11 MED ORDER — SOD CITRATE-CITRIC ACID 500-334 MG/5ML PO SOLN
30.0000 mL | ORAL | Status: AC
  Administered 2021-09-11: 30 mL via ORAL

## 2021-09-11 MED ORDER — LACTATED RINGERS IV SOLN: 500.0000 mL | Freq: Once | INTRAVENOUS | Status: DC

## 2021-09-11 MED ORDER — CEFAZOLIN SODIUM-DEXTROSE 2-4 GM/100ML-% IV SOLN: 2.0000 g | INTRAVENOUS | Status: AC

## 2021-09-11 MED ORDER — ACETAMINOPHEN 500 MG PO TABS
1000.0000 mg | ORAL_TABLET | Freq: Four times a day (QID) | ORAL | Status: DC
  Administered 2021-09-12 – 2021-09-14 (×10): 1000 mg via ORAL
  Filled 2021-09-11 (×10): qty 2

## 2021-09-11 MED ORDER — BUPIVACAINE HCL (PF) 0.5 % IJ SOLN
INTRAMUSCULAR | Status: AC
  Filled 2021-09-11: qty 60

## 2021-09-11 MED ORDER — GABAPENTIN 300 MG PO CAPS
300.0000 mg | ORAL_CAPSULE | Freq: Once | ORAL | Status: AC
  Administered 2021-09-11: 300 mg via ORAL
  Filled 2021-09-11: qty 1

## 2021-09-11 MED ORDER — ACETAMINOPHEN 500 MG PO TABS
1000.0000 mg | ORAL_TABLET | Freq: Once | ORAL | Status: AC
  Administered 2021-09-11: 1000 mg via ORAL

## 2021-09-11 MED ORDER — FENTANYL-BUPIVACAINE-NACL 0.5-0.125-0.9 MG/250ML-% EP SOLN: 12.0000 mL/h | EPIDURAL | Status: DC | PRN

## 2021-09-11 MED ORDER — VARICELLA VIRUS VACCINE LIVE 1350 PFU/0.5ML IJ SUSR
0.5000 mL | INTRAMUSCULAR | Status: AC | PRN
  Administered 2021-09-14: 0.5 mL via SUBCUTANEOUS
  Filled 2021-09-11 (×2): qty 0.5

## 2021-09-11 MED ORDER — CARBOPROST TROMETHAMINE 250 MCG/ML IM SOLN
INTRAMUSCULAR | Status: AC
  Filled 2021-09-11: qty 1

## 2021-09-11 MED ORDER — SODIUM CHLORIDE (PF) 0.9 % IJ SOLN
INTRAMUSCULAR | Status: AC
  Filled 2021-09-11: qty 50

## 2021-09-11 MED ORDER — IBUPROFEN 600 MG PO TABS
600.0000 mg | ORAL_TABLET | Freq: Four times a day (QID) | ORAL | Status: DC
  Administered 2021-09-13 – 2021-09-14 (×6): 600 mg via ORAL
  Filled 2021-09-11 (×6): qty 1

## 2021-09-11 MED ORDER — PHENYLEPHRINE HCL-NACL 20-0.9 MG/250ML-% IV SOLN
INTRAVENOUS | Status: DC | PRN
  Administered 2021-09-11: 45 ug/min via INTRAVENOUS

## 2021-09-11 MED ORDER — SODIUM CHLORIDE 0.9 % IV SOLN
500.0000 mg | INTRAVENOUS | Status: AC
  Administered 2021-09-11: 500 mg via INTRAVENOUS

## 2021-09-11 MED ORDER — MENTHOL 3 MG MT LOZG
1.0000 | LOZENGE | OROMUCOSAL | Status: DC | PRN
  Filled 2021-09-11: qty 9

## 2021-09-11 MED ORDER — DIPHENHYDRAMINE HCL 25 MG PO CAPS: 25.0000 mg | ORAL_CAPSULE | Freq: Four times a day (QID) | ORAL | Status: DC | PRN

## 2021-09-11 MED ORDER — FENTANYL CITRATE (PF) 100 MCG/2ML IJ SOLN: 25.0000 ug | INTRAMUSCULAR | Status: DC | PRN

## 2021-09-11 MED ORDER — FENTANYL CITRATE (PF) 100 MCG/2ML IJ SOLN
INTRAMUSCULAR | Status: AC
  Filled 2021-09-11: qty 2

## 2021-09-11 MED ORDER — MORPHINE SULFATE (PF) 2 MG/ML IV SOLN: 1.0000 mg | INTRAVENOUS | Status: DC | PRN

## 2021-09-11 MED ORDER — SOD CITRATE-CITRIC ACID 500-334 MG/5ML PO SOLN
ORAL | Status: AC
  Filled 2021-09-11: qty 15

## 2021-09-11 MED ORDER — GABAPENTIN 300 MG PO CAPS
300.0000 mg | ORAL_CAPSULE | Freq: Every day | ORAL | Status: DC
  Administered 2021-09-12 – 2021-09-13 (×3): 300 mg via ORAL
  Filled 2021-09-11 (×4): qty 1

## 2021-09-11 MED ORDER — NIFEDIPINE ER OSMOTIC RELEASE 30 MG PO TB24
30.0000 mg | ORAL_TABLET | Freq: Two times a day (BID) | ORAL | Status: DC
  Administered 2021-09-11: 30 mg via ORAL
  Filled 2021-09-11: qty 1

## 2021-09-11 MED ORDER — SIMETHICONE 80 MG PO CHEW
80.0000 mg | CHEWABLE_TABLET | Freq: Three times a day (TID) | ORAL | Status: DC
  Administered 2021-09-12 – 2021-09-14 (×7): 80 mg via ORAL
  Filled 2021-09-11 (×7): qty 1

## 2021-09-11 MED ORDER — NALOXONE HCL 4 MG/10ML IJ SOLN
1.0000 ug/kg/h | INTRAVENOUS | Status: DC | PRN
  Filled 2021-09-11: qty 5

## 2021-09-11 MED ORDER — OXYTOCIN-SODIUM CHLORIDE 30-0.9 UT/500ML-% IV SOLN
2.5000 [IU]/h | INTRAVENOUS | Status: AC
  Administered 2021-09-11: 2.5 [IU]/h via INTRAVENOUS

## 2021-09-11 MED ORDER — PRENATAL MULTIVITAMIN CH
1.0000 | ORAL_TABLET | Freq: Every day | ORAL | Status: DC
  Administered 2021-09-12 – 2021-09-14 (×3): 1 via ORAL
  Filled 2021-09-11 (×3): qty 1

## 2021-09-11 MED ORDER — KETOROLAC TROMETHAMINE 30 MG/ML IJ SOLN
30.0000 mg | Freq: Four times a day (QID) | INTRAMUSCULAR | Status: AC
  Administered 2021-09-12 (×3): 30 mg via INTRAVENOUS
  Filled 2021-09-11 (×3): qty 1

## 2021-09-11 MED ORDER — KETOROLAC TROMETHAMINE 30 MG/ML IJ SOLN
INTRAMUSCULAR | Status: AC
  Filled 2021-09-11: qty 1

## 2021-09-11 MED ORDER — OXYCODONE HCL 5 MG PO TABS
5.0000 mg | ORAL_TABLET | ORAL | Status: DC | PRN
  Administered 2021-09-13: 5 mg via ORAL
  Filled 2021-09-11: qty 1

## 2021-09-11 MED ORDER — LACTATED RINGERS IV SOLN: INTRAVENOUS | Status: DC

## 2021-09-11 MED ORDER — ACETAMINOPHEN 500 MG PO TABS
ORAL_TABLET | ORAL | Status: AC
  Filled 2021-09-11: qty 2

## 2021-09-11 MED ORDER — NALOXONE HCL 0.4 MG/ML IJ SOLN: 0.4000 mg | INTRAMUSCULAR | Status: DC | PRN

## 2021-09-11 MED ORDER — SIMETHICONE 80 MG PO CHEW: 80.0000 mg | CHEWABLE_TABLET | ORAL | Status: DC | PRN

## 2021-09-11 MED ORDER — BETAMETHASONE SOD PHOS & ACET 6 (3-3) MG/ML IJ SUSP
12.0000 mg | Freq: Once | INTRAMUSCULAR | Status: AC
  Administered 2021-09-11: 12 mg via INTRAMUSCULAR
  Filled 2021-09-11: qty 5

## 2021-09-11 SURGICAL SUPPLY — 32 items
BARRIER ADHS 3X4 INTERCEED (GAUZE/BANDAGES/DRESSINGS) ×3 IMPLANT
CHLORAPREP W/TINT 26 (MISCELLANEOUS) ×3 IMPLANT
DRSG EMULSION OIL 3X8 NADH (GAUZE/BANDAGES/DRESSINGS) ×2 IMPLANT
DRSG TELFA 3X8 NADH (GAUZE/BANDAGES/DRESSINGS) ×3 IMPLANT
ELECT CAUTERY BLADE 6.4 (BLADE) ×3 IMPLANT
ELECT REM PT RETURN 9FT ADLT (ELECTROSURGICAL) ×3
ELECTRODE REM PT RTRN 9FT ADLT (ELECTROSURGICAL) ×2 IMPLANT
GAUZE SPONGE 4X4 12PLY STRL (GAUZE/BANDAGES/DRESSINGS) ×3 IMPLANT
GAUZE SPONGE 4X4 12PLY STRL LF (GAUZE/BANDAGES/DRESSINGS) ×2 IMPLANT
GLOVE SURG SYN 8.0 (GLOVE) ×3 IMPLANT
GLOVE SURG SYN 8.0 PF PI (GLOVE) ×1 IMPLANT
GOWN STRL REUS W/ TWL LRG LVL3 (GOWN DISPOSABLE) ×4 IMPLANT
GOWN STRL REUS W/ TWL XL LVL3 (GOWN DISPOSABLE) ×2 IMPLANT
GOWN STRL REUS W/TWL LRG LVL3 (GOWN DISPOSABLE) ×2
GOWN STRL REUS W/TWL XL LVL3 (GOWN DISPOSABLE) ×1
MANIFOLD NEPTUNE II (INSTRUMENTS) ×3 IMPLANT
MAT PREVALON FULL STRYKER (MISCELLANEOUS) ×3 IMPLANT
NEEDLE HYPO 22GX1.5 SAFETY (NEEDLE) ×3 IMPLANT
NS IRRIG 1000ML POUR BTL (IV SOLUTION) ×3 IMPLANT
PACK C SECTION AR (MISCELLANEOUS) ×3 IMPLANT
PAD DRESSING TELFA 3X8 NADH (GAUZE/BANDAGES/DRESSINGS) ×1 IMPLANT
PAD OB MATERNITY 4.3X12.25 (PERSONAL CARE ITEMS) ×3 IMPLANT
PAD PREP 24X41 OB/GYN DISP (PERSONAL CARE ITEMS) ×3 IMPLANT
SCRUB EXIDINE 4% CHG 4OZ (MISCELLANEOUS) ×3 IMPLANT
STRAP SAFETY 5IN WIDE (MISCELLANEOUS) ×3 IMPLANT
SUCT VACUUM KIWI BELL (SUCTIONS) ×2 IMPLANT
SUT CHROMIC 1 CTX 36 (SUTURE) ×9 IMPLANT
SUT PLAIN GUT 0 (SUTURE) ×10 IMPLANT
SUT VIC AB 0 CT1 36 (SUTURE) ×6 IMPLANT
SYR 30ML LL (SYRINGE) ×6 IMPLANT
TAPE PAPER MEDFIX 1IN X 10YD (GAUZE/BANDAGES/DRESSINGS) ×2 IMPLANT
WATER STERILE IRR 500ML POUR (IV SOLUTION) ×3 IMPLANT

## 2021-09-11 NOTE — Progress Notes (Signed)
Labor Progress Note  Samantha Dominguez is a 41 y.o. [id number] at [redacted]w[redacted]d by ultrasound admitted for induction of labor due to [facility] and superimposed pre-eclampsia.  Subjective: pt resting comfortably  Objective: BP 127/72    Pulse 85    Temp 97.8 F (36.6 C) (Oral)    Resp 16    Ht 5\' 4"  (1.626 m)    Wt 103.4 kg    LMP 01/11/2021 (Exact Date)    SpO2 98%    BMI 39.14 kg/m  Notable VS details: reviewed  Fetal Assessment: FHT:  FHR: 140 bpm, variability: minimal ,  accelerations:  Abscent,  decelerations:  Present intermittent variable decelerations, early decelerations , periods of absent variability  Category/reactivity:  Category II UC:   irregular, every 6-8 minutes SVE:   did not recheck her cervix at this time, last cervical exam 1430 Dilation: 4cm  Effacement: 70%  Station:  -2  Consistency: soft  Position: middle  Membrane status:AROM @ 0645 Amniotic color: clear  Labs: Lab Results  Component Value Date   WBC 10.9 (H) 09/11/2021   HGB 13.3 09/11/2021   HCT 39.4 09/11/2021   MCV 79.0 (L) 09/11/2021   PLT 256 09/11/2021    Assessment / Plan: IOL at [redacted]w[redacted]d for chronic HTN and super imposed pre-eclampsia  Labor: Dr. Ouida Sills called at 1526 to say he reviewed the FHR tracing and that we should proceed with primary cesarean section for fetal intolerance of labor. Pitocin is still off. Notified patient and she is agreeable. Alerted L&D staff and anesthesia notified. Dr. Ouida Sills en route and will consent pt.  Preeclampsia:  on magnesium sulfate, intake and ouput balanced, and labs stable Fetal Wellbeing:  Category II Pain Control:   will have spinal placed I/D:   GBS negative, AROM x 9 hrs Anticipated MOD:   primary cesarean section  Gertie Fey, CNM 09/11/2021, 3:54 PM

## 2021-09-11 NOTE — Progress Notes (Signed)
Labor Progress Note  Samantha Dominguez is a 41 y.o. [phone] at 106w3d by ultrasound admitted for induction of labor due to chronic HTN and super imposed preeclampsia.  Subjective: she is feeling more uncomfortable, requesting epidural  Objective:  Vitals:   09/11/21 1330 09/11/21 1430  BP: 138/79 (!) 144/124  Pulse: 84 86  Resp: 14   Temp: 98 F (36.7 C)   SpO2: 98%    Notable VS details: reviewed, BP rechecked and 127/72  Fetal Assessment: FHT:  FHR: 135 bpm, variability: minimal ,  accelerations:  Abscent,  decelerations:  Present recurrent early decelerations, intermittent variable decelerations Category/reactivity:  Category II UC:   regular, every 2-5 minutes, MVUs 240-255, contractions palpate moderate, soft resting tone SVE:    Dilation: 4cm  Effacement: 70%  Station:  -2  Consistency: soft  Position: middle  Membrane status:AROM @ 0645 Amniotic color: clear Denied headaches, changes of vision, or RUQ pain  Labs: Lab Results  Component Value Date   WBC 8.7 09/11/2021   HGB 12.6 09/11/2021   HCT 37.1 09/11/2021   MCV 79.3 (L) 09/11/2021   PLT 278 09/11/2021    Assessment / Plan: IOL at [redacted]w[redacted]d for chronic HTN with superimposed pre-eclampsia, progressing slowly on pitocin  Labor:    She is becoming more uncomfortable and requesting an epidural. Minimal cervical change, fetal head rotation noted. Continued Cat II FHR tracing, occasional bouts of absent variability with no decelerations. Called Dr. Ouida Sills and reviewed FHR tracing, adequate MVUs, and cervical changes. Per Dr. Ouida Sills, stop pitocin until FHR tracing improvement and continue evaluating FHR tracing. Dr. Ouida Sills will evaluate the tracing as well. Pitocin stopped at this time. Preeclampsia:  on magnesium sulfate, no signs or symptoms of toxicity, and repeat CBC and CMP Fetal Wellbeing:  Category II Pain Control:   requesting epidural I/D:   AROM x 8hrs, GBS negative by [place], CNM 09/11/2021, 2:53 PM

## 2021-09-11 NOTE — Transfer of Care (Signed)
Immediate Anesthesia Transfer of Care Note  Patient: Samantha Dominguez  Procedure(s) Performed: CESAREAN SECTION WITH BILATERAL TUBAL LIGATION  Patient Location: PACU and Mother/Baby  Anesthesia Type:Spinal  Level of Consciousness: alert   Airway & Oxygen Therapy: Patient Spontanous Breathing  Post-op Assessment: Report given to RN  Post vital signs: stable  Last Vitals:  Vitals Value Taken Time  BP    Temp    Pulse    Resp    SpO2      Last Pain:  Vitals:   09/11/21 1452  TempSrc: Oral  PainSc:       Patients Stated Pain Goal: 0 ([id number])  Complications: No notable events documented.

## 2021-09-11 NOTE — Discharge Summary (Signed)
Obstetrical Discharge Summary  Patient Name: Samantha Dominguez DOB: 06-07-1981 MRN: [id number]  Date of Admission: 09/10/2021 Date of Delivery:09/11/21 Delivered by: Samantha Cote MD Date of Discharge: 09/14/21  Primary OB: [place]  [id number] last menstrual period was 01/11/2021 (exact date). EDC Estimated Date of Delivery: 10/27/21 Gestational Age at Delivery: [redacted]w[redacted]d   Antepartum complications: preeclampsia with severe features , elevated transamninases  Admitting Diagnosis: Chronic HTN with superimposed Pre- E Secondary Diagnosis: Patient Active Problem List   Diagnosis Date Noted   Cesarean delivery delivered 09/14/2021   Preterm delivery (maternal condition) 09/14/2021   Pre-eclampsia superimposed on chronic hypertension, antepartum 09/10/2021   Indication for care in labor or delivery 09/03/2021   Chronic hypertension affecting pregnancy 09/02/2021   COVID-19 affecting pregnancy in third trimester 09/02/2021   GDM (gestational diabetes mellitus) 09/02/2021   Chronic hypertension with superimposed pre-eclampsia 09/02/2021   Rh negative status during pregnancy 04/16/2021   Maternal varicella, non-immune 04/16/2021   Abnormal glucose affecting pregnancy 04/09/21: 3 hour GTT=equivocal 04/16/2021   Advanced maternal age in multigravida 41 yo 04/09/2021   GERD (gastroesophageal reflux disease) 04/09/2021   Low-level of literacy (completed first grade) 04/09/2021   Ptyalism 04/09/2021    Augmentation: Pitocin , arom  complications: non reassuring fetal monitoring active labor  Intrapartum complications/course: same as above  Date of Delivery: 09/11/21 Delivered By: Samantha Cote MD Delivery Type: primary LTCS Anesthesia: spinal  Placenta: manual Laceration:  Episiotomy: none Newborn Data: Live born child  Birth Weight:   APGAR: ,  Female Apgar 5/9  fetal wt 1420 gm  Nuchal cord and extreme twisting of fetal cord  Newborn Delivery   Birth date/time:   Delivery type: C-Section, Low Transverse C-section categorization: Primary        Postpartum Procedures: none  Edinburgh:  Edinburgh Postnatal Depression Scale Screening Tool 09/12/2021  I have been able to laugh and see the funny side of things. 0  I have looked forward with enjoyment to things. 0  I have blamed myself unnecessarily when things went wrong. 0  I have been anxious or worried for no good reason. 0  I have felt scared or panicky for no good reason. 0  Things have been getting on top of me. 0  I have been so unhappy that I have had difficulty sleeping. 0  I have felt sad or miserable. 0  I have been so unhappy that I have been crying. 1  The thought of harming myself has occurred to me. 0  Edinburgh Postnatal Depression Scale Total 1    Post partum course:  Patient had an uncomplicated postpartum course.  By time of discharge on POD#3, her pain was controlled on oral pain medications; she had appropriate lochia and was ambulating, voiding without difficulty, tolerating regular diet and passing flatus.   She was deemed stable for discharge to home.    Discharge Physical Exam:  BP 112/73 (BP Location: Left Arm)    Pulse 84    Temp 98.4 F (36.9 C) (Oral)    Resp 19    Ht 5\' 4"  (1.626 m)    Wt 103.4 kg    LMP 01/11/2021 (Exact Date)    SpO2 98%    Breastfeeding Unknown    BMI 39.14 kg/m   General: NAD CV: RRR Pulm: CTABL, nl effort ABD: s/nd/nt, fundus firm and below the umbilicus Lochia: moderate Incision: c/d/I - honey comb dressing in place DVT Evaluation: LE non-ttp, no evidence of DVT on exam.  Hemoglobin  Date Value Ref Range Status  09/13/2021 9.9 (L) 12.0 - 15.0 g/dL Final  04/09/2021 12.2 11.1 - 15.9 g/dL Final   HCT  Date Value Ref Range Status  09/13/2021 29.7 (L) 36.0 - 46.0 % Final   Hematocrit  Date Value Ref Range Status  04/09/2021 38.3 34.0 - 46.6 % Final     Disposition: stable, discharge to home. Baby Feeding: breastmilk Baby  Disposition: home with mom  Rh Immune globulin given:  Rubella vaccine given: N/a , varicella on d/c  Tdap vaccine given in AP or PP setting:  Flu vaccine given in AP or PP setting:   Contraception: BTL with c/s  Prenatal Labs:   Blood type/Rh O neg, rhogam given 08/05/21  Antibody screen neg  Rubella Immune  Varicella NON-Immune  RPR NR  HBsAg Neg  HIV NR  GC neg  Chlamydia neg  Genetic screening Declined genetic screening  1 hour GTT  144  3 hour GTT 105- [phone]  GBS  negative     Plan:  Samantha Dominguez was discharged to home in good condition. Follow-up appointment with delivering provider in 6 weeks.  Discharge Medications: Allergies as of 09/14/2021   No Known Allergies      Medication List     STOP taking these medications    aspirin EC 81 MG tablet   glyBURIDE 2.5 MG tablet Commonly known as: DIABETA   metFORMIN 500 MG 24 hr tablet Commonly known as: GLUCOPHAGE-XR   prenatal vitamin w/FE, FA 27-1 MG Tabs tablet       TAKE these medications    NIFEdipine 30 MG 24 hr tablet Commonly known as: ADALAT CC Take 1 tablet (30 mg total) by mouth 2 (two) times daily.   oxyCODONE 5 MG immediate release tablet Commonly known as: Oxy IR/ROXICODONE Take 1 tablet (5 mg total) by mouth every 4 (four) hours as needed for severe pain or breakthrough pain.         Follow-up [facility]. Schedule an appointment as soon as possible for a visit in 1 week(s).   Why: For a blood pressure check Nurse to make appointment Contact information: [place] [phone]                Signed: Clydene Dominguez  09/14/2021 11:34 AM

## 2021-09-11 NOTE — Op Note (Signed)
NAME: Samantha Dominguez, Samantha Dominguez MEDICAL RECORD NO: [id number] ACCOUNT NO: [id number] DATE OF BIRTH: 30-Jun-1981 FACILITY: [facility] LOCATION: [facility] PHYSICIAN: Suzy Bouchard, MD  Operative Report   DATE OF PROCEDURE: 09/11/2021  PREOPERATIVE DIAGNOSES: 1.  33+3 weeks estimated gestational age. 2.  Preeclampsia with severe features. 3.  Nonreassuring fetal monitoring, active phase of labor. 4.  Elective sterilization.  POSTOPERATIVE DIAGNOSES: 1.  33+3 weeks estimated gestational age. 2.  Preeclampsia with severe features. 3.  Nonreassuring fetal monitoring, active phase of labor. 4.  Elective sterilization. 5.  Extreme twisting of umbilical cord. 6.  Small for gestational age infant female.  PROCEDURE: 1.  Primary low transverse cesarean section. 2.  Bilateral tubal ligation, [place].  ANESTHESIA:  Spinal.  SURGEON:  Suzy Bouchard, MD  FIRST ASSISTANT:  Donato Schultz, certified nurse midwife.  INDICATIONS:  This is a 41 year old gravida 4, para 3 patient being followed for preeclampsia with severe features and was admitted to labor and delivery for induction of labor due to increasing liver transaminases.  On hospital day #2, the patient was  progressing to 4 cm when fetal monitoring demonstrated a decrease in minimal variability and recurrent late decelerations.  The patient has expressed a decision for permanent sterilization and reconfirmed that via the translator before proceeding to the  operating room.  DESCRIPTION OF PROCEDURE:  After adequate spinal anesthesia, the patient was placed in the dorsal supine position with hip roll on the right side.  The patient's abdomen was prepped and draped in normal sterile fashion.  The patient did receive 2 grams  of IV Ancef and 500 mg of azithromycin for surgical prophylaxis.  Timeout was performed.  A Pfannenstiel incision was made two fingerbreadths above symphysis pubis.  Sharp dissection was used to identify fascia  and fascia was opened in the midline and  opened in a transverse fashion.  The superior aspect of the fascia was grasped with Kocher clamps and the recti muscles were dissected free.  The inferior aspect of the fascia was grasped with Kocher clamps and the pyramidalis muscle was dissected free.   Entry into the peritoneal cavity was accomplished sharply.  The vesicouterine peritoneal fold was identified and opened and a bladder flap was created.  A low transverse uterine incision was made.  Upon entry into the endometrial cavity, clear fluid  resulted.  Small fetal head was then brought to the incision and a Kiwi bell vacuum was placed at the occiput and with one gentle pull, the head was delivered.  The vacuum was removed and a small vigorous female was then brought out of the uterus and was  placed on the mother's abdomen and the cord was doubly clamped and infant was passed to nursery staff.  Time of birth 48 on 09/11/2021.  Apgars were assigned of 5 and 8.  Fetal weight 1420 grams.  The placenta was manually delivered and the uterus was  exteriorized.  The endometrial cavity was wiped clean.  There were fragments of membranes with some difficulty with removal of all the membranes, but ultimately they were teased out.  The uterine incision was closed with 1 chromic suture in a running  locking fashion.  Two additional figure-of-eight sutures were required for good hemostasis.  Fallopian tubes and ovaries appeared normal.  Attention was directed to the patient's right fallopian tube, which was grasped with the mid portion of the  fallopian tube and 2 separate 0 plain gut sutures were placed and a 1.5 cm portion of fallopian tube  was removed.  Good hemostasis was noted.  Similar procedure was repeated on the patient's left fallopian tube. After grasping the fallopian tube in the  midportion, 2 separate 0 plain gut sutures were placed and a 1.5 cm portion of fallopian tube was removed, good hemostasis was  noted.  Posterior cul-de-sac was irrigated and suctioned and the uterus was placed back into the abdominal cavity and good  hemostasis was noted.  The posterior cul-de-sac was irrigated and suctioned and the uterus was placed back into the abdominal cavity and the paracolic gutters were wiped clean with laparotomy tape.  Both tubal ligation sites appeared hemostatic.  The  uterine incision also appeared hemostatic.  Interceed was placed over the uterine incision in T-shape fashion and the fascia was closed over top after ensuring good hemostasis subfascially.  Fascia was closed with 0 Vicryl suture in a running nonlocking  fashion.  Two separate sutures were used.  The fascial edges were then injected with a solution of 60 mL Marcaine plus 20 of normal saline, approximately 45 mL of the solution was utilized.  Subcutaneous tissues were irrigated and bovied for hemostasis  and given the depth of the subcutaneous tissues of 5 cm, the subcutaneous tissues were closed with a 2-0 chromic suture.  The skin was reapproximated with Insorb absorbable staples.  An additional 25 mL of Marcaine solution was injected beneath the skin.   There were no complications.  QUANTITATIVE BLOOD LOSS: 755 mL.  INTRAOPERATIVE FLUIDS:  1000 mL.  URINE OUTPUT:  300 mL.  DISPOSITION:  The patient tolerated the procedure well and was taken to recovery room in good condition.   NIK D: 09/11/2021 5:42:41 pm T: 09/11/2021 11:03:00 pm  JOB: [id number]/ [id number]

## 2021-09-11 NOTE — Progress Notes (Signed)
Labor Progress Note  Samantha Dominguez Dominguez is a 41 y.o. [id number] at [redacted]w[redacted]d by ultrasound admitted for induction of labor due to [facility] with SIPE.  Subjective: denies sx PIH- mild HA during the night and given tylenol.  feeling painful cramping, otherwise well.   Objective: BP 137/79 (BP Location: Right Arm)    Pulse 80    Temp 98 F (36.7 C)    Resp 18    Ht 5\' 4"  (1.626 m)    Wt 103.4 kg    LMP 01/11/2021 (Exact Date)    SpO2 98%    BMI 39.14 kg/m    Vitals:   09/10/21 2030 09/10/21 2046 09/10/21 2101 09/10/21 2130  BP: (!) 155/101 (!) 162/91 (!) 147/92 (!) 143/98   09/11/21 0030 09/11/21 0130 09/11/21 0145 09/11/21 0208  BP: (!) 147/100 (!) 160/103 (!) 160/101 140/89   09/11/21 0230 09/11/21 0330 09/11/21 0430 09/11/21 0532  BP: 139/85 (!) 155/94 (!) 158/98 137/79    Fetal Assessment: FHT:  FHR: 135 bpm, variability: mod,  accelerations:  Present,  decelerations:  Present occasional variable decel Category/reactivity:  Category II UC:   regular, every 3-8 minutes, pitocin now at 56mu/min SVE:  2/thick/-2, soft/anterior. AROM performed, scant clear fluid.  Membrane status: AROM at 0628 Amniotic color: n/a  Total I/O In: 2865 [P.O.:1405; I.V.:1460] Out: 3550 [Urine:3550]   Labs: Lab Results  Component Value Date   WBC 10.4 09/10/2021   HGB 13.2 09/10/2021   HCT 38.8 09/10/2021   MCV 78.5 (L) 09/10/2021   PLT 300 09/10/2021     Assessment / Plan: [id number] at [redacted]w[redacted]d with CHTN with SIPE, GDMA2, AMA  Labor:  ripening with low dose pitocin, plan to maintain 7mu/min  AROM now, variables with UCs, toco not tracing well. Updated Dr Glennon Mac.  GDMA2: CBG q2hr, 78-149; consider Endotool if persistently > 120. Preeclampsia:  on magnesium sulfate, intake and ouput balanced, and labs stable; repeat Labs ordered this Samantha Dominguez. Severe range BP x 1 overnight and given labetalol 20mg  IV.  Fetal Wellbeing:  Category II- continue to monitor closely, mod variability and occasional variables.  Pain  Control:  Labor support without medications, desires epidural I/D:   GBS Neg by [place]agoner, CNM 09/11/2021, 6:40 Samantha Dominguez

## 2021-09-11 NOTE — Progress Notes (Signed)
Inpatient Diabetes Program Recommendations  Diabetes Treatment Program Recommendations  ADA Standards of Care Diabetes in Pregnancy Target Glucose Ranges:  Fasting: 60 - 90 mg/dL Preprandial: 60 - 659 mg/dL 1 hr postprandial: Less than 140mg /dL (from first bite of meal) 2 hr postprandial: Less than 120 mg/dL (from first bite of meal)     Latest Reference Range & Units 09/11/21 01:57 09/11/21 03:42 09/11/21 05:53 09/11/21 07:58  Glucose-Capillary 70 - 99 mg/dL 83 91 09/13/21 (H) 935 (H)  (H): Data is abnormally high   Latest Reference Range & Units 09/10/21 17:58 09/10/21 19:58 09/10/21 21:55 09/10/21 23:48  Glucose-Capillary 70 - 99 mg/dL 88 09/12/21 (H) 78 779 (H)  (H): Data is abnormally high  Review of Glycemic Control  Diabetes history: GDM; [redacted]W[redacted]D Outpatient Diabetes medications: Metformin XR 500 mg BID, Glyburide 2.5 mg QHS Current orders for Inpatient glycemic control: CBGs Q2H  Inpatient Diabetes Program Recommendations:    Insulin: If glucose becomes 120 mg/dl x 2, then would use IV insulin for glycemic control. Following delivery, recommend using Glycemic Control order set and ordering CBGs AC&HS with Novolog 0-6 units AC&HS.   Note: Noted consult for Diabetes Coordinator. Diabetes Coordinator is not on campus over the weekend but available by pager from 8am to 5pm for questions or concerns. Chart reviewed. Per H&P on 09/10/21, "Pregnancy further complicated by GDMA2 on metformin and glyburide with unknown level of glucose control since shes not checking BG at home. She received BMTZ one week ago."   Thanks,  09/12/21, RN, MSN, CDE Diabetes Coordinator Inpatient Diabetes Program [phone] (Team Pager from 8am to 5pm)

## 2021-09-11 NOTE — Progress Notes (Addendum)
Labor Progress Note  Samantha Dominguez is a 41 y.o. [id number] at [redacted]w[redacted]d by ultrasound admitted for induction of labor due to [facility] and superimposed preeclampsia.  Subjective: resting comfortably in bed, reports contractions are getting stronger but are not painful yet  Objective: BP 138/87 (BP Location: Right Arm)    Pulse 82    Temp 98 F (36.7 C) (Oral)    Resp 16    Ht 5\' 4"  (1.626 m)    Wt 103.4 kg    LMP 01/11/2021 (Exact Date)    SpO2 94%    BMI 39.14 kg/m  Notable VS details: reviewed  Fetal Assessment: FHT:  FHR: 130 bpm, variability: moderate,  accelerations:  Abscent,  decelerations:  Present intermittent early and variable decelerations Category/reactivity:  Category II UC:   irregular, every 6 minutes SVE:   at 0640 per R. McVey, CNM Dilation: 2cm  Effacement: thick  Station:  -2  Consistency: soft  Position: anterior  Membrane status:AROM @ 0640 Amniotic color: clear, pink tinged DTRs: +1, clonus negative  Labs: Lab Results  Component Value Date   WBC 8.7 09/11/2021   HGB 12.6 09/11/2021   HCT 37.1 09/11/2021   MCV 79.3 (L) 09/11/2021   PLT 278 09/11/2021   Assessment / Plan: Induction of labor due to preeclampsia and chronic HTN,  progressing well on pitocin  Labor:  receiving pitocin, currently on 12mU/min, AROM with pink tinged amniotic fluid. Intermittent variable and early decels with contractions. Updated Dr. Ouida Sills on fetal tracing. Continuing to monitor. Continuing with Pitocin.  GDMa2: CBG q2hr, last CBG 113 Preeclampsia:  on magnesium sulfate, intake and ouput balanced, and labs stable, BP reviewed Fetal Wellbeing:  Category II - continue to monitor closely, min to mod variability, intermittent variable and early decels Pain Control:   will request epidural when she wants one I/D:   GBS neg by PCR, AROM x2hrs Anticipated MOD:  NSVD  Gertie Fey, CNM 09/11/2021, 9:14 AM

## 2021-09-11 NOTE — Progress Notes (Signed)
Pt IOL day #2 for preeclampsia with severe features , worsening transaminases . IUPC and FSA in place . Fetal monitoring with minimal variablity and recurrent late decels .  Recommend LTCS for non reassuring fetal monitoring  Translator Internet translated as I counseled her for risks . Organ injury , blood transfusion risks , Infection risks , DVT/ PE risks all discussed . Pt desires BTL  and she confirm her desire for no additional children . All questions answered .

## 2021-09-11 NOTE — Anesthesia Procedure Notes (Addendum)
Spinal  Patient location during procedure: OR Start time: 09/11/2021 4:17 PM End time: 09/11/2021 4:18 PM Reason for block: surgical anesthesia Staffing Performed: resident/CRNA  Anesthesiologist: Martha Clan, MD Resident/CRNA: Garner Nash, CRNA Preanesthetic Checklist Completed: patient identified, IV checked, site marked, risks and benefits discussed, surgical consent, monitors and equipment checked, pre-op evaluation and timeout performed Spinal Block Patient position: sitting Prep: ChloraPrep Patient monitoring: heart rate, cardiac monitor, continuous pulse ox and blood pressure Approach: midline Location: L3-4 Injection technique: single-shot Needle Needle type: Sprotte  Needle gauge: 24 G Needle length: 9 cm Assessment Sensory level: T4 Events: CSF return Additional Notes IV functioning, monitors applied to pt. Expiration date of kit checked and confirmed to be in date. Sterile prep and drape, hand hygiene and sterile gloved used. Pt was positioned and spine was prepped in sterile fashion. Skin was anesthetized with lidocaine. Free flow of clear CSF obtained prior to injecting local anesthetic into CSF x 1 attempt. Spinal needle aspirated freely following injection. Needle was carefully withdrawn, and pt tolerated procedure well. Loss of motor and sensory on exam post injection.

## 2021-09-11 NOTE — Progress Notes (Signed)
Patient has not delivered her baby at this time. CSW will speak with patient once she has delivered her baby and got settled after labor.

## 2021-09-11 NOTE — Anesthesia Preprocedure Evaluation (Signed)
Anesthesia Evaluation  Patient identified by MRN, date of birth, ID band Patient awake    Reviewed: Allergy & Precautions, H&P , NPO status , Patient's Chart, lab work & pertinent test results, reviewed documented beta blocker date and time   History of Anesthesia Complications Negative for: history of anesthetic complications  Airway Mallampati: III  TM Distance: >3 FB Neck ROM: full    Dental  (+) Dental Advidsory Given, Teeth Intact   Pulmonary neg shortness of breath, neg COPD, Recent URI  (Covid positive on precautions),    Pulmonary exam normal breath sounds clear to auscultation       Cardiovascular Exercise Tolerance: Good hypertension, (-) angina(-) Past MI and (-) Cardiac Stents Normal cardiovascular exam(-) dysrhythmias (-) Valvular Problems/Murmurs Rhythm:regular Rate:Normal     Neuro/Psych negative neurological ROS  negative psych ROS   GI/Hepatic Neg liver ROS, GERD  ,  Endo/Other  diabetes, GestationalMorbid obesity  Renal/GU negative Renal ROS  negative genitourinary   Musculoskeletal   Abdominal   Peds  Hematology negative hematology ROS (+)   Anesthesia Other Findings Past Medical History: 09/02/2021: Chronic hypertension affecting pregnancy 09/02/2021: GDM (gestational diabetes mellitus) 04/08/2021: GERD (gastroesophageal reflux disease)     Comment:  states burning sensation and nausea after eats 04/08/2021: Headache     Comment:  states occasional headache   Reproductive/Obstetrics negative OB ROS                             Anesthesia Physical Anesthesia Plan  ASA: 3  Anesthesia Plan: Spinal   Post-op Pain Management:    Induction:   PONV Risk Score and Plan:   Airway Management Planned: Natural Airway  Additional Equipment:   Intra-op Plan:   Post-operative Plan:   Informed Consent: I have reviewed the patients History and Physical, chart, labs and  discussed the procedure including the risks, benefits and alternatives for the proposed anesthesia with the patient or authorized representative who has indicated his/her understanding and acceptance.     Dental Advisory Given  Plan Discussed with: Anesthesiologist, CRNA and Surgeon  Anesthesia Plan Comments:         Anesthesia Quick Evaluation

## 2021-09-11 NOTE — Progress Notes (Signed)
Labor Progress Note  Samantha Dominguez is a 41 y.o. [id number] at [redacted]w[redacted]d by ultrasound admitted for induction of labor due to chronic HTN with superimposed pre=eclampsia.  Subjective: resting in bed, reports contractions are getting more intense, 8/10. Denies headache, changes of vision, or RUQ pain.  Objective: BP (!) 148/91 (BP Location: Right Arm)    Pulse 78    Temp 98 F (36.7 C) (Oral)    Resp 16    Ht 5\' 4"  (1.626 m)    Wt 103.4 kg    LMP 01/11/2021 (Exact Date)    SpO2 99%    BMI 39.14 kg/m  Notable VS details: reviewed  Fetal Assessment: FHT:  FHR: 140 bpm, variability: minimal ,  accelerations:  Abscent,  decelerations:  Present intermittent early, variable, and late decelerations Category/reactivity:  Category II UC:   regular, every 2-5 minutes, IUPC MVUs 220 SVE:    Dilation: 3cm  Effacement: 70%  Station:  -1  Consistency: soft  Position: anterior  Membrane status:AROM @ 0640 Amniotic color: clear Bloody show present DTRs: +1, no clonus  Labs: Lab Results  Component Value Date   WBC 8.7 09/11/2021   HGB 12.6 09/11/2021   HCT 37.1 09/11/2021   MCV 79.3 (L) 09/11/2021   PLT 278 09/11/2021    Assessment / Plan: IOL at [redacted]w[redacted]d for chronic HTN with superimposed pre-eclampsia , progressing on pitocin  Labor: pitocin currently on 38mU/min with cervical change, reviewed fetal heart tracing and POC with Dr. Ouida Sills. Per Dr. Ouida Sills, place FSE for better fetal tracing, benefits outweigh risks; rescue dose of BMZ for fetal lung maturity d/t 8 days since last dose of BMZ; and social work consult for preterm birth/1st grade education/unable to read and write/raising children by herself. Continue to monitor FHR tracing. Continue with pitocin. Preeclampsia:  on magnesium sulfate, intake and ouput balanced, and labs stable Fetal Wellbeing:  Category II Pain Control:   labor support without medications, declines epidural at this time, will want epidural when contractions  stronger I/D:   GBS negative by PCR, AROM x 6.5hrs Anticipated MOD:  NSVD  Gertie Fey, CNM 09/11/2021, 12:52 PM

## 2021-09-11 NOTE — Brief Op Note (Signed)
09/11/2021  5:16 PM  PATIENT:  Samantha Dominguez  41 y.o. female  PRE-OPERATIVE DIAGNOSIS:  Cesarean section w/ bilateral tubal ligation  POST-OPERATIVE DIAGNOSIS:  Cesarean section w/ bilateral tubal ligation Non reassuring fetal monitoring   PROCEDURE:  Procedure(s): CESAREAN SECTION WITH BILATERAL TUBAL LIGATION LTCS  and BTL  SURGEON:  Surgeon(s) and Role:    * Meeya Goldin, Ihor Austin, MD - Primary  PHYSICIAN ASSISTANT: Donato Schultz , CNM  ASSISTANTS: none   ANESTHESIA:   spinal  EBL:  QBL  775 mL   BLOOD ADMINISTERED:none  DRAINS: Urinary Catheter (Foley)   LOCAL MEDICATIONS USED:  MARCAINE     SPECIMEN:  Source of Specimen:  portion bilateral fallopian tube , and placenta  DISPOSITION OF SPECIMEN:  PATHOLOGY  COUNTS:  YES  TOURNIQUET:  * No tourniquets in log *  DICTATION: .Other Dictation: Dictation Number verbal   PLAN OF CARE: Admit to inpatient   PATIENT DISPOSITION:  PACU - hemodynamically stable.   Delay start of Pharmacological VTE agent (>24hrs) due to surgical blood loss or risk of bleeding: not applicable

## 2021-09-12 LAB — COMPREHENSIVE METABOLIC PANEL
ALT: 124 U/L — ABNORMAL HIGH (ref 0–44)
AST: 57 U/L — ABNORMAL HIGH (ref 15–41)
Albumin: 2.7 g/dL — ABNORMAL LOW (ref 3.5–5.0)
Alkaline Phosphatase: 135 U/L — ABNORMAL HIGH (ref 38–126)
Anion gap: 7 (ref 5–15)
BUN: 8 mg/dL (ref 6–20)
CO2: 24 mmol/L (ref 22–32)
Calcium: 6.7 mg/dL — ABNORMAL LOW (ref 8.9–10.3)
Chloride: 98 mmol/L (ref 98–111)
Creatinine, Ser: 0.56 mg/dL (ref 0.44–1.00)
GFR, Estimated: >60 mL/min (ref >60)
Glucose, Bld: 122 mg/dL — ABNORMAL HIGH (ref 70–99)
Potassium: 4.3 mmol/L (ref 3.5–5.1)
Sodium: 129 mmol/L — ABNORMAL LOW (ref 135–145)
Total Bilirubin: 0.5 mg/dL (ref 0.3–1.2)
Total Protein: 6.4 g/dL — ABNORMAL LOW (ref 6.5–8.1)

## 2021-09-12 LAB — FETAL SCREEN: Fetal Screen: NEGATIVE

## 2021-09-12 LAB — CBC
HCT: 31.4 % — ABNORMAL LOW (ref 36.0–46.0)
Hemoglobin: 10.8 g/dL — ABNORMAL LOW (ref 12.0–15.0)
MCH: 27.1 pg (ref 26.0–34.0)
MCHC: 34.4 g/dL (ref 30.0–36.0)
MCV: 78.9 fL — ABNORMAL LOW (ref 80.0–100.0)
Platelets: 252 10*3/uL (ref 150–400)
RBC: 3.98 MIL/uL (ref 3.87–5.11)
RDW: 14 % (ref 11.5–15.5)
WBC: 17.1 10*3/uL — ABNORMAL HIGH (ref 4.0–10.5)
nRBC: 0 % (ref 0.0–0.2)

## 2021-09-12 LAB — MAGNESIUM: Magnesium: 5.4 mg/dL — ABNORMAL HIGH (ref 1.7–2.4)

## 2021-09-12 MED ORDER — FERROUS SULFATE 325 (65 FE) MG PO TABS
325.0000 mg | ORAL_TABLET | Freq: Two times a day (BID) | ORAL | Status: DC
  Administered 2021-09-12 – 2021-09-14 (×5): 325 mg via ORAL
  Filled 2021-09-12 (×5): qty 1

## 2021-09-12 MED ORDER — RHO D IMMUNE GLOBULIN 1500 UNIT/2ML IJ SOSY
300.0000 ug | PREFILLED_SYRINGE | Freq: Once | INTRAMUSCULAR | Status: AC
  Administered 2021-09-12: 300 ug via INTRAVENOUS
  Filled 2021-09-12: qty 2

## 2021-09-12 NOTE — Plan of Care (Signed)
Transferred to Room 351 form L&D, Post Partum Mag. Interpreter Donald Siva [phone] accessed for Assessment, orientation and Education.

## 2021-09-12 NOTE — Clinical Social Work Note (Signed)
Clinical Social Work Assessment  Patient Details  Name: Samantha Dominguez MRN: [id number] Date of Birth: Jan 02, 1981  Date of referral:  09/12/21               Reason for consult:  Other (Comment Required) (Concerns that mother doesn't read and write and might need community resources)                Permission sought to share information with:    Permission granted to share information::     Name::        Agency::     Relationship::     Contact Information:     Housing/Transportation Living arrangements for the past 2 months:  Single Family Home Source of Information:  Patient Patient Interpreter Needed:  Spanish Criminal Activity/Legal Involvement Pertinent to Current Situation/Hospitalization:  No - Comment as needed Significant Relationships:  Significant Other Lives with:  Minor Children Do you feel safe going back to the place where you live?  Yes Need for family participation in patient care:  Yes (Comment)  Care giving concerns:  No concerns     Facilities manager / plan:  CSW spoke with patient to see if she was in need of any support or resources with raising her children. Patient stated she has medicaid and food stamps. Patient stated she was working prior to getting sick during her pregnancy. Patient stated that she has support from Willa Frater who is the father of Eliezer Lofts her middle child and her newborn. CSW was told that Stann Mainland is at home currently with the girls and he is paying for ren and the light bill. Patient stated her oldest child is enrolled in school but missed Thursday and Friday due to her delivering early. Patient asked about applying for medicaid and pediatricians. CSW let patient know she will need to go through DSS and also the nurse will provide a patient with pediatrician list. Patient does not have transportation and will need assistance with getting home.   Employment status:   (Patient stated she was working prior to getting sick during her  pregnancy.) Insurance information:  Medicaid [place] PT Recommendations:    Information / Referral to community resources:     Patient/Family's Response to care:  Accepting   Patient/Family's Understanding of and Emotional Response to Diagnosis, Current Treatment, and Prognosis:  No concerns acted appropriately.   Emotional Assessment Appearance:  Appears stated age Attitude/Demeanor/Rapport:  Engaged Affect (typically observed):  Accepting Orientation:  Oriented to Self, Oriented to Place, Oriented to  Time, Oriented to Situation Alcohol / Substance use:  Not Applicable Psych involvement (Current and /or in the community):  No (Comment)  Discharge Needs  Concerns to be addressed:  No discharge needs identified Readmission within the last 30 days:    Current discharge risk:  None Barriers to Discharge:  No Barriers Identified   Raina Mina, Metcalf 09/12/2021, 2:21 PM

## 2021-09-12 NOTE — Progress Notes (Signed)
Post Partum Day 1 Subjective: Doing well, no complaints.  Tolerating regular diet, pain with PO meds, voiding and ambulating without difficulty. Has been to NICU to see her baby.  No CP SOB Fever,Chills, N/V or leg pain; denies nipple or breast pain, no HA change of vision, RUQ/epigastric pain  Objective: BP 138/78 (BP Location: Right Arm)    Pulse 86    Temp 98.2 F (36.8 C)    Resp 18    Ht 5\' 4"  (1.626 m)    Wt 103.4 kg    LMP 01/11/2021 (Exact Date)    SpO2 97%    Breastfeeding Unknown    BMI 39.14 kg/m    Physical Exam:  General: NAD Breasts: soft/nontender CV: RRR Pulm: nl effort, CTABL Abdomen: soft, NT, BS x 4 Incision: Dsg CDI, no erythema or drainage Lochia: moderate Uterine Fundus: fundus firm and 2 fb below umbilicus DVT Evaluation: no cords, ttp LEs   Recent Labs    09/11/21 1502 09/12/21 0439  HGB 13.3 10.8*  HCT 39.4 31.4*  WBC 10.9* 17.1*  PLT 256 252   CMP Latest Ref Rng & Units 09/12/2021 09/11/2021 09/11/2021  Glucose 70 - 99 mg/dL 09/13/2021) 825(K) 539(J)  BUN 6 - 20 mg/dL 8 6 8   Creatinine 0.44 - 1.00 mg/dL 673(A 1.93  Sodium 135 - 145 mmol/L 129(L) 129(L) 130(L)  Potassium 3.5 - 5.1 mmol/L 4.3 3.8 3.9  Chloride 98 - 111 mmol/L 98 97(L) 97(L)  CO2 22 - 32 mmol/L 24 22 24   Calcium 8.9 - 10.3 mg/dL 6.7(L) 7.2(L) 7.6(L)  Total Protein 6.5 - 8.1 g/dL 6.4(L) 7.4 7.0  Total Bilirubin 0.3 - 1.2 mg/dL 0.5 0.6 0.4  Alkaline Phos 38 - 126 U/L 135(H) 175(H) 160(H)  AST 15 - 41 U/L 57(H) 65(H) 60(H)  ALT 0 - 44 U/L 124(H) 142(H) 130(H)     Intake/Output Summary (Last 24 hours) at 09/12/2021 0915 Last data filed at 09/12/2021 0400 Gross per 24 hour  Intake 6183.79 ml  Output 5525 ml  Net 658.79 ml    Assessment/Plan: 41 y.o. 09/14/2021 postpartum day # 1  - Continue routine PP care, adequate urine output - Lactation consult, pumping and providing colostrum to baby in NICU - BTL for contraception  - Acute blood loss anemia - hemodynamically stable and  asymptomatic; start po ferrous sulfate BID with stool softeners  - Immunization status: Needs varicella prior to DC   Disposition: Does not desire Dc home today.   09/14/2021, CNM 09/12/2021 9:12 AM

## 2021-09-12 NOTE — Progress Notes (Signed)
Interpreter Wynona Canes [phone] accessed to Interpret medication administration of Gabapentin, Nifedipine and Coconut Oil. Pt. V/O and Denies questions.

## 2021-09-12 NOTE — Progress Notes (Signed)
Assisted Mom with Pumping and Colostrum taken to SCN.

## 2021-09-13 ENCOUNTER — Encounter: Payer: Self-pay | Admitting: Obstetrics and Gynecology

## 2021-09-13 LAB — COMPREHENSIVE METABOLIC PANEL
ALT: 81 U/L — ABNORMAL HIGH (ref 0–44)
AST: 29 U/L (ref 15–41)
Albumin: 2.6 g/dL — ABNORMAL LOW (ref 3.5–5.0)
Alkaline Phosphatase: 103 U/L (ref 38–126)
Anion gap: 8 (ref 5–15)
BUN: 11 mg/dL (ref 6–20)
CO2: 23 mmol/L (ref 22–32)
Calcium: 7.7 mg/dL — ABNORMAL LOW (ref 8.9–10.3)
Chloride: 107 mmol/L (ref 98–111)
Creatinine, Ser: 0.61 mg/dL (ref 0.44–1.00)
GFR, Estimated: >60 mL/min (ref >60)
Glucose, Bld: 92 mg/dL (ref 70–99)
Potassium: 3.7 mmol/L (ref 3.5–5.1)
Sodium: 138 mmol/L (ref 135–145)
Total Bilirubin: 0.2 mg/dL — ABNORMAL LOW (ref 0.3–1.2)
Total Protein: 6.1 g/dL — ABNORMAL LOW (ref 6.5–8.1)

## 2021-09-13 LAB — CBC
HCT: 29.7 % — ABNORMAL LOW (ref 36.0–46.0)
Hemoglobin: 9.9 g/dL — ABNORMAL LOW (ref 12.0–15.0)
MCH: 26.7 pg (ref 26.0–34.0)
MCHC: 33.3 g/dL (ref 30.0–36.0)
MCV: 80.1 fL (ref 80.0–100.0)
Platelets: 271 10*3/uL (ref 150–400)
RBC: 3.71 MIL/uL — ABNORMAL LOW (ref 3.87–5.11)
RDW: 14.8 % (ref 11.5–15.5)
WBC: 17.6 10*3/uL — ABNORMAL HIGH (ref 4.0–10.5)
nRBC: 0 % (ref 0.0–0.2)

## 2021-09-13 NOTE — Anesthesia Postprocedure Evaluation (Signed)
Anesthesia Post Note  Patient: Samantha Dominguez  Procedure(s) Performed: CESAREAN SECTION WITH BILATERAL TUBAL LIGATION  Patient location during evaluation: Mother Baby Anesthesia Type: Spinal Level of consciousness: awake and alert and oriented Pain management: pain level controlled Vital Signs Assessment: post-procedure vital signs reviewed and stable Respiratory status: respiratory function stable Cardiovascular status: stable Postop Assessment: no headache, no backache, patient able to bend at knees, no apparent nausea or vomiting, able to ambulate and adequate PO intake Anesthetic complications: no   No notable events documented.   Last Vitals:  Vitals:   09/13/21 0252 09/13/21 0802  BP: 110/61 121/76  Pulse: 86 89  Resp: 20 18  Temp: 37.1 C 37.2 C  SpO2: 98% 97%    Last Pain:  Vitals:   09/13/21 0802  TempSrc: Oral  PainSc:                  Lanora Manis

## 2021-09-13 NOTE — Progress Notes (Signed)
Reassessed Pt. For further bleeding and/or passing of clots. Pt. Noted to be sleeping but awoke easily for assessment. Fundus is firm at U/-1 with small Lochia. No clots on Pad and Pt denies pain.

## 2021-09-13 NOTE — Progress Notes (Addendum)
Called to room by Pt. Pt. Has passed a large clot with tissue fragments; appx. The size of a small orange. Fundus is firm at U/1 with scant Lochia. Pt. Instructed via [place] [phone] to call Rn if she passes another clot and/or vaginal bleeding increases. Pt. V/O. Pt. States she felt as if she was having contractions prior to passing clot but feels better now. Pt. Instructed to call for RN if she needs pain medication and she v/o via Interpreter. Placed clot in bag in Pt. Bathroom so that oncoming shift can show Provider on rounds.

## 2021-09-13 NOTE — Progress Notes (Signed)
Postop Day  2 Interpreter used for communication.   Subjective: up ad lib, voiding, tolerating PO, and + flatus Reports passing 2 clots over the past 12 hours.  States she felt like she was having labor contractions and then felt better after she passed the clot.   Doing well, no concerns. Ambulating without difficulty, pain managed with PO meds, tolerating regular diet, and voiding without difficulty.   No fever/chills, chest pain, shortness of breath, nausea/vomiting, or leg pain. No nipple or breast pain. No headache, visual changes, or RUQ/epigastric pain.  Objective: BP 121/76 (BP Location: Left Arm)    Pulse 89    Temp 99 F (37.2 C) (Oral)    Resp 18    Ht 5\' 4"  (1.626 m)    Wt 103.4 kg    LMP 01/11/2021 (Exact Date)    SpO2 97%    Breastfeeding Unknown    BMI 39.14 kg/m    Physical Exam:  General: alert, cooperative, and no distress Breasts: soft/nontender CV: RRR Pulm: nl effort Abdomen: soft, non-tender, active bowel sounds Uterine Fundus: firm Incision: no significant drainage Perineum: minimal edema, intact Lochia: appropriate DVT Evaluation: No evidence of DVT seen on physical exam.  Recent Labs    09/11/21 1502 09/12/21 0439  HGB 13.3 10.8*  HCT 39.4 31.4*  WBC 10.9* 17.1*  PLT 256 252    Assessment/Plan: 41 y.o. 41 postpartum day # 2  -Continue routine postpartum care -Visiting infant in SCN -Lactation consult PRN for breastfeeding  -S/P BTL -> completed during primary c/section  -BP's controlled on Procardia 30mg  XL BID  -TOC consult completed  -Will need d/c teaching on medications and will need to have medication color coordinated so she can identify her correct medications  -Acute blood loss anemia - hemodynamically stable and asymptomatic; start PO ferrous sulfate BID with stool softeners  -Immunization status:   needs Varicella prior to discharge    Disposition: Continue inpatient postpartum care   LOS: 3 days   [id number],  CNM 09/13/2021, 12:15 PM   ----- Gustavo Lah  Certified Nurse Midwife [facility]

## 2021-09-14 ENCOUNTER — Ambulatory Visit: Payer: Medicaid Other

## 2021-09-14 LAB — SURGICAL PATHOLOGY

## 2021-09-14 MED ORDER — NIFEDIPINE ER 30 MG PO TB24: 30.0000 mg | ORAL_TABLET | Freq: Two times a day (BID) | ORAL | 0 refills | Status: AC

## 2021-09-14 MED ORDER — OXYCODONE HCL 5 MG PO TABS: 5.0000 mg | ORAL_TABLET | ORAL | 0 refills | Status: AC | PRN

## 2021-09-14 NOTE — Progress Notes (Incomplete)
Reviewed AVS and discharge teaching with an interpreter. Mother verbalized understanding. Discharge home infant remains in SCN

## 2021-09-14 NOTE — Lactation Note (Signed)
This note was copied from a baby's chart. Lactation Consultation Note  Patient Name: Samantha Dominguez Date: 09/14/2021   Age:41 hours  Mom being discharged today. She has been pumping every 3 hours, increasing in volumes of output. Loaned hospital DEBP until able to obtain through [facility]. Symphony [phone]   Danford Bad 09/14/2021, 1:10 PM

## 2021-09-15 LAB — RHOGAM INJECTION: Unit division: 0

## 2022-07-26 NOTE — Telephone Encounter (Signed)
Done
